# Patient Record
Sex: Male | Born: 1985
Health system: Southern US, Community
[De-identification: ages and names within clinical notes are randomized; demographics above are authoritative.]

## PROBLEM LIST (undated history)

## (undated) DIAGNOSIS — F419 Anxiety disorder, unspecified: Secondary | ICD-10-CM

---

## 2010-08-29 ENCOUNTER — Emergency Department (HOSPITAL_COMMUNITY)
Admission: EM | Admit: 2010-08-29 | Discharge: 2010-08-30 | Disposition: A | Discharge status: Home / Self Care | Payer: Self-pay | Attending: Emergency Medicine | Admitting: Emergency Medicine

## 2010-08-29 DIAGNOSIS — B9789 Other viral agents as the cause of diseases classified elsewhere: Secondary | ICD-10-CM | POA: Insufficient documentation

## 2010-08-29 DIAGNOSIS — J029 Acute pharyngitis, unspecified: Secondary | ICD-10-CM | POA: Insufficient documentation

## 2010-08-29 DIAGNOSIS — R509 Fever, unspecified: Secondary | ICD-10-CM | POA: Insufficient documentation

## 2010-08-29 DIAGNOSIS — R110 Nausea: Secondary | ICD-10-CM | POA: Insufficient documentation

## 2011-11-03 ENCOUNTER — Emergency Department (INDEPENDENT_AMBULATORY_CARE_PROVIDER_SITE_OTHER): Payer: No Typology Code available for payment source

## 2011-11-03 ENCOUNTER — Emergency Department (HOSPITAL_BASED_OUTPATIENT_CLINIC_OR_DEPARTMENT_OTHER)
Admission: EM | Admit: 2011-11-03 | Discharge: 2011-11-03 | Disposition: A | Discharge status: Home / Self Care | Payer: No Typology Code available for payment source | Attending: Emergency Medicine | Admitting: Emergency Medicine

## 2011-11-03 ENCOUNTER — Encounter (HOSPITAL_BASED_OUTPATIENT_CLINIC_OR_DEPARTMENT_OTHER): Payer: Self-pay | Admitting: *Deleted

## 2011-11-03 DIAGNOSIS — M25569 Pain in unspecified knee: Secondary | ICD-10-CM | POA: Insufficient documentation

## 2011-11-03 DIAGNOSIS — M542 Cervicalgia: Secondary | ICD-10-CM

## 2011-11-03 DIAGNOSIS — R079 Chest pain, unspecified: Secondary | ICD-10-CM

## 2011-11-03 DIAGNOSIS — S20219A Contusion of unspecified front wall of thorax, initial encounter: Secondary | ICD-10-CM

## 2011-11-03 MED ORDER — TETANUS-DIPHTH-ACELL PERTUSSIS 5-2.5-18.5 LF-MCG/0.5 IM SUSP
0.5000 mL | Freq: Once | INTRAMUSCULAR | Status: AC
  Administered 2011-11-03: 0.5 mL via INTRAMUSCULAR
  Filled 2011-11-03: qty 0.5

## 2011-11-03 MED ORDER — TRAMADOL HCL 50 MG PO TABS: 50.0000 mg | ORAL_TABLET | Freq: Four times a day (QID) | ORAL | Status: AC | PRN
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
## 2011-11-03 NOTE — Discharge Instructions (Signed)
Contusion A contusion is a deep bruise. Contusions are the result of an injury that caused bleeding under the skin. The contusion may turn blue, purple, or yellow. Minor injuries will give you a painless contusion, but more severe contusions may stay painful and swollen for a few weeks.  CAUSES  A contusion is usually caused by a blow, trauma, or direct force to an area of the body. SYMPTOMS   Swelling and redness of the injured area.   Bruising of the injured area.   Tenderness and soreness of the injured area.   Pain.  DIAGNOSIS  The diagnosis can be made by taking a history and physical exam. An X-ray, CT scan, or MRI may be needed to determine if there were any associated injuries, such as fractures. TREATMENT  Specific treatment will depend on what area of the body was injured. In general, the best treatment for a contusion is resting, icing, elevating, and applying cold compresses to the injured area. Over-the-counter medicines may also be recommended for pain control. Ask your caregiver what the best treatment is for your contusion. HOME CARE INSTRUCTIONS   Put ice on the injured area.   Put ice in a plastic bag.   Place a towel between your skin and the bag.   Leave the ice on for 15 to 20 minutes, 3 to 4 times a day.   Only take over-the-counter or prescription medicines for pain, discomfort, or fever as directed by your caregiver. Your caregiver may recommend avoiding anti-inflammatory medicines (aspirin, ibuprofen, and naproxen) for 48 hours because these medicines may increase bruising.   Rest the injured area.   If possible, elevate the injured area to reduce swelling.  SEEK IMMEDIATE MEDICAL CARE IF:   You have increased bruising or swelling.   You have pain that is getting worse.   Your swelling or pain is not relieved with medicines.  MAKE SURE YOU:   Understand these instructions.   Will watch your condition.   Will get help right away if you are not  doing well or get worse.  Document Released: 03/27/2005 Document Revised: 06/06/2011 Document Reviewed: 04/22/2011 [facility] Patient Information 2012 [place], [place].Chest Contusion A contusion is a deep bruise. Bruises happen when an injury causes bleeding under the skin. Signs of bruising include pain, puffiness (swelling), and discolored skin. The bruise may turn blue, purple, or yellow. Pay attention to how you are doing. HOME CARE  Put ice on the injured area.   Put ice in a plastic bag.   Place a towel between the skin and the bag.   Leave the ice on for 15 to 20 minutes at a time, 3 to 4 times a day for the first 48 hours.   Rest.   Do not lift anything heavy.   Limit your activity as told by your doctor   Take 3 to 4 deep breaths every hour while awake. Hold your hand or a pillow over the sore area for support.   Breathe from the belly (abdomen).   Breathe in through the nose, as if you are smelling a flower.   Breathe out through the mouth, as if you are blowing out a candle.   Only take medicine as told by your doctor.  GET HELP RIGHT AWAY IF:   You have trouble breathing or cough up thick spit (mucus).   You have chest pain that goes into the arms or jaw.   The skin is wet and pale.   You have  a fever.   You feel dizzy, weak, or pass out (faint).   You cannot breathe easily.   The bruise is getting worse.  MAKE SURE YOU:   Understand these instructions.   Will watch your condition.   Will get help right away if you are not doing well or get worse.  Document Released: 12/04/2007 Document Revised: 06/06/2011 Document Reviewed: 12/04/2007 [facility] Patient Information 2012 [place], [place].

## 2012-05-27 ENCOUNTER — Encounter (HOSPITAL_BASED_OUTPATIENT_CLINIC_OR_DEPARTMENT_OTHER): Payer: Self-pay | Admitting: *Deleted

## 2012-05-27 ENCOUNTER — Emergency Department (HOSPITAL_BASED_OUTPATIENT_CLINIC_OR_DEPARTMENT_OTHER)
Admission: EM | Admit: 2012-05-27 | Discharge: 2012-05-27 | Disposition: A | Discharge status: Home / Self Care | Payer: Self-pay | Attending: Emergency Medicine | Admitting: Emergency Medicine

## 2012-05-27 DIAGNOSIS — R05 Cough: Secondary | ICD-10-CM | POA: Insufficient documentation

## 2012-05-27 DIAGNOSIS — R509 Fever, unspecified: Secondary | ICD-10-CM | POA: Insufficient documentation

## 2012-05-27 DIAGNOSIS — F17200 Nicotine dependence, unspecified, uncomplicated: Secondary | ICD-10-CM | POA: Insufficient documentation

## 2012-05-27 DIAGNOSIS — R059 Cough, unspecified: Secondary | ICD-10-CM | POA: Insufficient documentation

## 2012-05-27 DIAGNOSIS — J020 Streptococcal pharyngitis: Secondary | ICD-10-CM | POA: Insufficient documentation

## 2012-05-27 DIAGNOSIS — J3489 Other specified disorders of nose and nasal sinuses: Secondary | ICD-10-CM | POA: Insufficient documentation

## 2012-05-27 MED ORDER — PENICILLIN G BENZATHINE 1200000 UNIT/2ML IM SUSP
1.2000 10*6.[IU] | Freq: Once | INTRAMUSCULAR | Status: AC
  Administered 2012-05-27: 1.2 10*6.[IU] via INTRAMUSCULAR
  Filled 2012-05-27: qty 2

## 2012-05-27 NOTE — ED Notes (Signed)
Pt amb to triage with quick steady gait in nad. Pt reports cough, congestion, sore throat and subjective temps since Monday.
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
## 2012-09-29 ENCOUNTER — Emergency Department (HOSPITAL_BASED_OUTPATIENT_CLINIC_OR_DEPARTMENT_OTHER)
Admission: EM | Admit: 2012-09-29 | Discharge: 2012-09-30 | Disposition: A | Discharge status: Home / Self Care | Payer: Self-pay | Attending: Emergency Medicine | Admitting: Emergency Medicine

## 2012-09-29 ENCOUNTER — Encounter (HOSPITAL_BASED_OUTPATIENT_CLINIC_OR_DEPARTMENT_OTHER): Payer: Self-pay | Admitting: *Deleted

## 2012-09-29 DIAGNOSIS — Z87828 Personal history of other (healed) physical injury and trauma: Secondary | ICD-10-CM | POA: Insufficient documentation

## 2012-09-29 DIAGNOSIS — R002 Palpitations: Secondary | ICD-10-CM

## 2012-09-29 DIAGNOSIS — F17200 Nicotine dependence, unspecified, uncomplicated: Secondary | ICD-10-CM | POA: Insufficient documentation

## 2012-09-29 DIAGNOSIS — R079 Chest pain, unspecified: Secondary | ICD-10-CM | POA: Insufficient documentation

## 2012-09-29 NOTE — ED Notes (Signed)
States he feels like his heart is beating to fast x 1 month. Today it was beating fast after playing basketball with his son.

## 2012-09-29 NOTE — ED Provider Notes (Signed)
History     CSN: [id number]  Arrival date & time 09/29/12  2111   First MD Initiated Contact with Patient 09/29/12 2300      Chief Complaint  Patient presents with  . Palpitations    (Consider location/radiation/quality/duration/timing/severity/associated sxs/prior treatment) HPI This is a 27 year old male previously healthy comes in today complaining of sharp left chest pain intermittently for several months. It comes on for a few seconds and resolves without intervention. It does not occur at night while sleeping. He does have some pain with deep breath occasionally. He had some injury to his chest wall and lifting several months ago but this has resolved. He states there have been times when he felt short of breath but then could place pressure on the left chest wall and it would resolve. He has not had any nausea, syncope, lightheadedness, or shortness of breath noted. He has no family history of early deaths, coronary artery disease, or DVTs or PEs. He has not been traveling has had no known recent illness.  History reviewed. No pertinent past medical history.  History reviewed. No pertinent past surgical history.  No family history on file.  History  Substance Use Topics  . Smoking status: Current Every Day Smoker -- 0.50 packs/day    Types: Cigarettes  . Smokeless tobacco: Not on file  . Alcohol Use: No      Review of Systems  All other systems reviewed and are negative.    Allergies  Review of patient's allergies indicates no known allergies.  Home Medications  No current outpatient prescriptions on file.  BP 139/74  Pulse 93  Temp(Src) 98.8 F (37.1 C) (Oral)  Resp 20  Wt 135 lb (61.236 kg)  BMI 19.93 kg/m2  SpO2 96%  Physical Exam  Nursing note and vitals reviewed. Constitutional: He is oriented to person, place, and time. He appears well-developed and well-nourished.  HENT:  Head: Normocephalic and atraumatic.  Right Ear: External ear normal.   Left Ear: External ear normal.  Nose: Nose normal.  Mouth/Throat: Oropharynx is clear and moist.  Eyes: Conjunctivae and EOM are normal. Pupils are equal, round, and reactive to light.  Neck: Normal range of motion. Neck supple.  Cardiovascular: Normal rate, regular rhythm, normal heart sounds and intact distal pulses.   Pulmonary/Chest: Effort normal and breath sounds normal. No respiratory distress. He has no wheezes. He exhibits no tenderness.  Abdominal: Soft. Bowel sounds are normal. He exhibits no distension and no mass. There is no tenderness. There is no guarding.  Musculoskeletal: Normal range of motion.  Neurological: He is alert and oriented to person, place, and time. He has normal reflexes. He exhibits normal muscle tone. Coordination normal.  Skin: Skin is warm and dry.  Psychiatric: He has a normal mood and affect. His behavior is normal. Judgment and thought content normal.    ED Course  Procedures (including critical care time)  Labs Reviewed - No data to display No results found.   No diagnosis found.    Date: 09/29/2012  Rate: 87  Rhythm: normal sinus rhythm  QRS Axis: normal  Intervals: normal  ST/T Wave abnormalities: normal  Conduction Disutrbances:none  Narrative Interpretation:   Old EKG Reviewed: none available   Symptoms discussed with patient and discussed with patient symptoms were to return to the emergency department. He is advised that he should obtain primary care and have full metabolic assessment. Eyes return if the symptoms, do not resolve, shortness of breath or worsening of his pain.  His symptoms consistent with activity and palpate palpitations but no symptoms concerning for coronary artery disease or severe dysrhythmia.        Hilario Quarry, MD 09/29/12 2351

## 2013-01-13 ENCOUNTER — Emergency Department (HOSPITAL_BASED_OUTPATIENT_CLINIC_OR_DEPARTMENT_OTHER)
Admission: EM | Admit: 2013-01-13 | Discharge: 2013-01-13 | Disposition: A | Discharge status: Home / Self Care | Payer: Self-pay | Attending: Emergency Medicine | Admitting: Emergency Medicine

## 2013-01-13 ENCOUNTER — Encounter (HOSPITAL_BASED_OUTPATIENT_CLINIC_OR_DEPARTMENT_OTHER): Payer: Self-pay | Admitting: *Deleted

## 2013-01-13 DIAGNOSIS — R599 Enlarged lymph nodes, unspecified: Secondary | ICD-10-CM | POA: Insufficient documentation

## 2013-01-13 DIAGNOSIS — F17200 Nicotine dependence, unspecified, uncomplicated: Secondary | ICD-10-CM | POA: Insufficient documentation

## 2013-01-13 DIAGNOSIS — J020 Streptococcal pharyngitis: Secondary | ICD-10-CM | POA: Insufficient documentation

## 2013-01-13 MED ORDER — IBUPROFEN 100 MG/5ML PO SUSP
800.0000 mg | Freq: Once | ORAL | Status: AC
  Administered 2013-01-13: 800 mg via ORAL
  Filled 2013-01-13: qty 40

## 2013-01-13 MED ORDER — PENICILLIN G BENZATHINE 1200000 UNIT/2ML IM SUSP
1.2000 10*6.[IU] | Freq: Once | INTRAMUSCULAR | Status: AC
  Administered 2013-01-13: 1.2 10*6.[IU] via INTRAMUSCULAR
  Filled 2013-01-13: qty 2

## 2013-01-13 NOTE — ED Notes (Signed)
Pt c/o sore throat x2 days with fever 

## 2013-01-13 NOTE — ED Notes (Signed)
MD at bedside. 

## 2013-01-13 NOTE — ED Provider Notes (Signed)
   History    CSN: [id number] Arrival date & time 01/13/13  1247  First MD Initiated Contact with Patient 01/13/13 1257     Chief Complaint  Patient presents with  . Sore Throat   (Consider location/radiation/quality/duration/timing/severity/associated sxs/prior Treatment) Patient is a 27 y.o. male presenting with pharyngitis. The history is provided by the patient.  Sore Throat This is a new problem. The current episode started yesterday. The problem occurs constantly. The problem has been rapidly worsening. The symptoms are aggravated by swallowing. Nothing relieves the symptoms. Treatments tried: nsaids. The treatment provided no relief.   History reviewed. No pertinent past medical history. History reviewed. No pertinent past surgical history. History reviewed. No pertinent family history. History  Substance Use Topics  . Smoking status: Current Every Day Smoker -- 0.50 packs/day    Types: Cigarettes  . Smokeless tobacco: Not on file  . Alcohol Use: No    Review of Systems  All other systems reviewed and are negative.    Allergies  Review of patient's allergies indicates no known allergies.  Home Medications  No current outpatient prescriptions on file. BP 111/72  Pulse 117  Temp(Src) 102.8 F (39.3 C) (Oral)  Resp 18  Ht 5\' 9"  (1.753 m)  Wt 135 lb (61.236 kg)  BMI 19.93 kg/m2  SpO2 100% Physical Exam  Nursing note and vitals reviewed. Constitutional: He is oriented to person, place, and time. He appears well-developed and well-nourished. No distress.  HENT:  Head: Normocephalic and atraumatic.  PO erythematous with slight exudates.  Neck: Normal range of motion. Neck supple.  Abdominal: Soft. Bowel sounds are normal.  Lymphadenopathy:    He has cervical adenopathy.  Neurological: He is alert and oriented to person, place, and time.  Skin: Skin is warm and dry. He is not diaphoretic.    ED Course  Procedures (including critical care time) Labs Reviewed   RAPID STREP SCREEN   No results found. No diagnosis found.  MDM  Patient wants bicillin.    Geoffery Lyons, MD 01/13/13 1321

## 2013-04-28 ENCOUNTER — Emergency Department (HOSPITAL_BASED_OUTPATIENT_CLINIC_OR_DEPARTMENT_OTHER)
Admission: EM | Admit: 2013-04-28 | Discharge: 2013-04-28 | Disposition: A | Discharge status: Home / Self Care | Payer: Self-pay | Attending: Emergency Medicine | Admitting: Emergency Medicine

## 2013-04-28 ENCOUNTER — Encounter (HOSPITAL_BASED_OUTPATIENT_CLINIC_OR_DEPARTMENT_OTHER): Payer: Self-pay | Admitting: Emergency Medicine

## 2013-04-28 DIAGNOSIS — F17200 Nicotine dependence, unspecified, uncomplicated: Secondary | ICD-10-CM | POA: Insufficient documentation

## 2013-04-28 DIAGNOSIS — R509 Fever, unspecified: Secondary | ICD-10-CM | POA: Insufficient documentation

## 2013-04-28 DIAGNOSIS — IMO0001 Reserved for inherently not codable concepts without codable children: Secondary | ICD-10-CM | POA: Insufficient documentation

## 2013-04-28 DIAGNOSIS — J029 Acute pharyngitis, unspecified: Secondary | ICD-10-CM | POA: Insufficient documentation

## 2013-04-28 MED ORDER — PENICILLIN G BENZATHINE 1200000 UNIT/2ML IM SUSP
1.2000 10*6.[IU] | Freq: Once | INTRAMUSCULAR | Status: AC
Start: 2013-04-28 — End: 2013-04-28
  Administered 2013-04-28: 1.2 10*6.[IU] via INTRAMUSCULAR
  Filled 2013-04-28: qty 2

## 2013-04-28 NOTE — ED Provider Notes (Signed)
Medical screening examination/treatment/procedure(s) were performed by non-physician practitioner and as supervising physician I was immediately available for consultation/collaboration.  EKG Interpretation   None         Kamy Poinsett, MD 04/28/13 1930 

## 2013-04-28 NOTE — ED Provider Notes (Signed)
CSN: [id number]     Arrival date & time 04/28/13  1759 History   First MD Initiated Contact with Patient 04/28/13 1804     Chief Complaint  Patient presents with  . Sore Throat   (Consider location/radiation/quality/duration/timing/severity/associated sxs/prior Treatment) Patient is a 27 y.o. male presenting with pharyngitis. The history is provided by the patient. No language interpreter was used.  Sore Throat This is a new problem. The current episode started yesterday. The problem occurs constantly. The problem has been unchanged. Associated symptoms include a fever and myalgias. Pertinent negatives include no congestion or coughing. The symptoms are aggravated by swallowing. He has tried NSAIDs for the symptoms. The treatment provided mild relief.    History reviewed. No pertinent past medical history. History reviewed. No pertinent past surgical history. History reviewed. No pertinent family history. History  Substance Use Topics  . Smoking status: Current Every Day Smoker -- 0.50 packs/day    Types: Cigarettes  . Smokeless tobacco: Not on file  . Alcohol Use: No    Review of Systems  Constitutional: Positive for fever.  HENT: Negative for congestion.   Respiratory: Negative for cough.   Cardiovascular: Negative.   Musculoskeletal: Positive for myalgias.    Allergies  Review of patient's allergies indicates no known allergies.  Home Medications   Current Outpatient Rx  Name  Route  Sig  Dispense  Refill  . ibuprofen (ADVIL,MOTRIN) 800 MG tablet   Oral   Take 800 mg by mouth every 8 (eight) hours as needed for pain.          BP 119/70  Pulse 110  Temp(Src) 99.7 F (37.6 C) (Oral)  Resp 18  Ht 5\' 9"  (1.753 m)  Wt 140 lb (63.504 kg)  BMI 20.67 kg/m2  SpO2 96% Physical Exam  Nursing note and vitals reviewed. Constitutional: He appears well-developed and well-nourished.  HENT:  Right Ear: External ear normal.  Left Ear: External ear normal.  Mouth/Throat:  Oropharyngeal exudate and posterior oropharyngeal erythema present. No tonsillar abscesses.  Cardiovascular: Normal rate and regular rhythm.   Pulmonary/Chest: Effort normal and breath sounds normal.  Abdominal: Soft. Bowel sounds are normal. There is no tenderness.    ED Course  Procedures (including critical care time) Labs Review Labs Reviewed - No data to display Imaging Review No results found.  EKG Interpretation   None       MDM   1. Pharyngitis    Will treat for strep based on history and exam    Teressa Lower, NP 04/28/13 1826

## 2013-04-28 NOTE — ED Notes (Signed)
Pt c/o sore throat x 1 day

## 2014-07-17 ENCOUNTER — Encounter (HOSPITAL_COMMUNITY): Payer: Self-pay | Admitting: Emergency Medicine

## 2014-07-17 ENCOUNTER — Emergency Department (HOSPITAL_COMMUNITY)
Admission: EM | Admit: 2014-07-17 | Discharge: 2014-07-17 | Disposition: A | Discharge status: Home / Self Care | Payer: Self-pay | Attending: Emergency Medicine | Admitting: Emergency Medicine

## 2014-07-17 DIAGNOSIS — Z720 Tobacco use: Secondary | ICD-10-CM | POA: Insufficient documentation

## 2014-07-17 DIAGNOSIS — J029 Acute pharyngitis, unspecified: Secondary | ICD-10-CM

## 2014-07-17 LAB — RAPID STREP SCREEN (MED CTR MEBANE ONLY): Streptococcus, Group A Screen (Direct): NEGATIVE

## 2014-07-17 MED ORDER — AMOXICILLIN 500 MG PO CAPS: 500.0000 mg | ORAL_CAPSULE | Freq: Three times a day (TID) | ORAL | Status: DC

## 2014-07-17 MED ORDER — SODIUM CHLORIDE 0.9 % IV BOLUS (SEPSIS)
500.0000 mL | Freq: Once | INTRAVENOUS | Status: AC
  Administered 2014-07-17: 500 mL via INTRAVENOUS

## 2014-07-17 MED ORDER — HYDROCODONE-ACETAMINOPHEN 7.5-325 MG/15ML PO SOLN: 15.0000 mL | ORAL | Status: DC | PRN

## 2014-07-17 MED ORDER — HYDROCODONE-ACETAMINOPHEN 7.5-325 MG/15ML PO SOLN
10.0000 mL | Freq: Once | ORAL | Status: AC
  Administered 2014-07-17: 10 mL via ORAL
  Filled 2014-07-17: qty 15

## 2014-07-17 NOTE — ED Provider Notes (Signed)
CSN: [id number]     Arrival date & time 07/17/14  0532 History   None    Chief Complaint  Patient presents with  . Sore Throat     (Consider location/radiation/quality/duration/timing/severity/associated sxs/prior Treatment) HPI Timothy Stark is a 29 y.o. male he comes in for evaluation of sore throat. Patient states he frequently gets sore throats and this feels like one of his typical episodes. States the pain has been present for about the past 2 days, but got worse last night. He has been taking Tylenol, ibuprofen and DayQuil with moderate relief. He reports fevers at home that have been controlled by these medications. He denies cough, headache, chest pain, short of breath, drooling, difficulties breathing or swallowing. Reports he quit smoking 2 months ago.  History reviewed. No pertinent past medical history. History reviewed. No pertinent past surgical history. History reviewed. No pertinent family history. History  Substance Use Topics  . Smoking status: Current Every Day Smoker -- 0.00 packs/day  . Smokeless tobacco: Not on file     Comment: Engineer, maintenance (IT)  . Alcohol Use: No    Review of Systems  All other systems reviewed and are negative.  A 10 point review of systems was completed and was negative except for pertinent positives and negatives as mentioned in the history of present illness     Allergies  Review of patient's allergies indicates no known allergies.  Home Medications   Prior to Admission medications   Medication Sig Start Date End Date Taking? Authorizing Provider  acetaminophen (TYLENOL) 325 MG tablet Take 650 mg by mouth every 6 (six) hours as needed for fever.   Yes Historical Provider, MD  ibuprofen (ADVIL,MOTRIN) 800 MG tablet Take 800 mg by mouth every 8 (eight) hours as needed for fever.    Yes Historical Provider, MD  Pseudoephedrine-APAP-DM (DAYQUIL PO) Take 1 capsule by mouth daily as needed (fever / congestion).   Yes  Historical Provider, MD  amoxicillin (AMOXIL) 500 MG capsule Take 1 capsule (500 mg total) by mouth 3 (three) times daily. 07/17/14   Sharlene Motts, PA-C  HYDROcodone-acetaminophen (HYCET) 7.5-325 mg/15 ml solution Take 15 mLs by mouth every 4 (four) hours as needed for moderate pain or severe pain. 07/17/14   Earle Gell Samella Lucchetti, PA-C   BP 109/57 mmHg  Pulse 90  Temp(Src) 99.1 F (37.3 C) (Oral)  Resp 16  SpO2 95% Physical Exam  Constitutional: He is oriented to person, place, and time. He appears well-developed and well-nourished.  HENT:  Head: Normocephalic and atraumatic.  Mild erythema in posterior oropharynx. No exudates present. Tonsils appear equal in size. There is no glossal elevation. No trismus. MMM. Tolerating secretions well, patent airway  Eyes: Conjunctivae are normal. Pupils are equal, round, and reactive to light. Right eye exhibits no discharge. Left eye exhibits no discharge. No scleral icterus.  Neck: Normal range of motion. Neck supple.  Cardiovascular: Normal rate, regular rhythm and normal heart sounds.   Pulmonary/Chest: Effort normal and breath sounds normal. No respiratory distress. He has no wheezes. He has no rales.  Abdominal: Soft. There is no tenderness.  Musculoskeletal: He exhibits no tenderness.  Lymphadenopathy:    He has cervical adenopathy.  Neurological: He is alert and oriented to person, place, and time.  Cranial Nerves II-XII grossly intact  Skin: Skin is warm and dry. No rash noted.  Psychiatric: He has a normal mood and affect.  Nursing note and vitals reviewed.   ED Course  Procedures (including critical care  time) Labs Review Labs Reviewed  RAPID STREP SCREEN  CULTURE, GROUP A STREP    Imaging Review No results found.   EKG Interpretation None     Meds given in ED:  Medications  HYDROcodone-acetaminophen (HYCET) 7.5-325 mg/15 ml solution 10 mL (10 mLs Oral Given 07/17/14 0722)  sodium chloride 0.9 % bolus 500 mL (0 mLs  Intravenous Stopped 07/17/14 0904)    Discharge Medication List as of 07/17/2014  7:03 AM    START taking these medications   Details  amoxicillin (AMOXIL) 500 MG capsule Take 1 capsule (500 mg total) by mouth 3 (three) times daily., Starting 07/17/2014, Until Discontinued, Print    HYDROcodone-acetaminophen (HYCET) 7.5-325 mg/15 ml solution Take 15 mLs by mouth every 4 (four) hours as needed for moderate pain or severe pain., Starting 07/17/2014, Until Discontinued, Print       Filed Vitals:   07/17/14 0730 07/17/14 0800 07/17/14 0900 07/17/14 0904  BP: 105/69 95/50 109/57   Pulse: 92 88 90   Temp:    99.1 F (37.3 C)  TempSrc:    Oral  Resp:      SpO2: 95% 93% 95%     MDM  Vitals stable - WNL -afebrile, temp 99.1 Pt resting comfortably in ED, appears well. PE--mild pharyngitis. No evidence of peritonsillar abscess. Patent Airway, tolerating secretions well.  Due to subjective fevers and fever on arrival, no cough, cervical adenopathy.-Will treat empirically with antibiotics Will DC with antibiotics, pain medicine. Encouraged continue symptomatic support with salt water gargles, coarse expiratory, Tylenol and ibuprofen I discussed all relevant lab findings and imaging results with pt and they verbalized understanding. Discussed f/u with PCP within 48 hrs and return precautions, pt very amenable to plan.  Prior to patient discharge, I discussed and reviewed this case with Dr. Madilyn Hookees   Final diagnoses:  Pharyngitis        Sharlene MottsBenjamin W Nelva Hauk, PA-C 07/17/14 0932  Tilden FossaElizabeth Rees, MD 07/17/14 [phone]

## 2014-07-17 NOTE — Discharge Instructions (Signed)
It is important for you to follow up with primary care, [place] and wellness for further evaluation and management of your symptoms. Please take all of your medicines as prescribed. You may take her pain medicine for severe pain. He may continue to alternate Tylenol and Motrin for fever relief. Continue using salt water gargles. Return to ED for worsening symptoms, worsening fever, difficulties swallowing, breathing, shortness of breath   Emergency Department Resource Guide 1) Find a Doctor and Pay Out of Pocket Although you won't have to find out who is covered by your insurance plan, it is a good idea to ask around and get recommendations. You will then need to call the office and see if the doctor you have chosen will accept you as a new patient and what types of options they offer for patients who are self-pay. Some doctors offer discounts or will set up payment plans for their patients who do not have insurance, but you will need to ask so you aren't surprised when you get to your appointment.  2) Contact Your Local Health Department Not all health departments have doctors that can see patients for sick visits, but many do, so it is worth a call to see if yours does. If you don't know where your local health department is, you can check in your phone book. The CDC also has a tool to help you locate your state's health department, and many state websites also have listings of all of their local health departments.  3) Find a Walk-in Clinic If your illness is not likely to be very severe or complicated, you may want to try a walk in clinic. These are popping up all over the country in pharmacies, drugstores, and shopping centers. They're usually staffed by nurse practitioners or physician assistants that have been trained to treat common illnesses and complaints. They're usually fairly quick and inexpensive. However, if you have serious medical issues or chronic medical problems, these are probably  not your best option.  No Primary Care Doctor: - Call Health Connect at  [phone] - they can help you locate a primary care doctor that  accepts your insurance, provides certain services, etc. - Physician Referral Service- [phone]  Chronic Pain Problems: Organization         Address  Phone   Notes  [facility]  [phone] Patients need to be referred by their primary care doctor.   Medication Assistance: Organization         Address  Phone   Notes  [facility] Medication [facility] [address] [place] [place], [place] [address] [phone] --Must be a resident of [facility] -- Must have NO insurance coverage whatsoever (no Medicaid/ Medicare, etc.) -- The pt. MUST have a primary care doctor that directs their care regularly and follows them in the community   [facility]  [phone]   [employer]  [phone]    Agencies that provide inexpensive medical care: Organization         Address  Phone   Notes  [facility]  [phone]   [facility]    [phone]   [facility] [address] [place], [place] [address] [phone]   [facility] [address] [place], [place] [phone]   [facility]anned Parenthood    [phone]   [facility]    [phone]   [facility]  [address], [place] Phone:  [phone], Fax:  [phone] Hours of Operation:  9 am - 6 pm, M-F.  Also accepts Medicaid/Medicare and self-pay.  [facility]  [address], [place] Phone: [phone], Fax: [phone]. Hours of Operation:  8:30 am - 5:30 pm, M-F.  Also accepts Medicaid and self-pay.  [facility] [place] [address] [place] Phone: [phone]   [facility] [address] Natasha Bence [place], [place] [phone], Ext. [phone] Mondays & Thursdays: 7-9 AM.   First 15 patients are seen on a first come, first serve basis.    Medicaid-accepting [facility] Providers:  Organization         Address  Phone   Notes  [facility] [address], [place] [phone] Also accepts self-pay patients.  [facility] [address] Laurell Josephs [place], [place]  [phone]   [facility] [address], [place] [phone]   [facility] [address], [place] [phone]   Renaye Rakers [address], [place]   [phone] Only accepts [place] patients after they have their name applied to their card.   Self-Pay (no insurance) in [facility]:  Organization         Address  Phone   Notes  Sickle Cell Patients, [facility] [address] [place], [place] [phone]   [facility] [address] [place], [place] [phone]   [facility] [place]  [address], [place] [phone]   [facility]/Dr. Osei-Bonsu  [address], [place] or [address], [place] [phone] Phone number for both [place] and [place] locations is the same.  [facility] [address], [place] [phone]   [facility] [address] [place] or [address] [phone]   [facility] [address], [place] [phone], phone; [phone], fax Sees patients 1st and 3rd Saturday of every month.  Must not qualify for public or private insurance (i.e. Medicaid, Medicare, [place] Health Choice, Veterans' Benefits)  Household income should be no more than 200% of the poverty level The clinic cannot treat you if you are pregnant or think you are pregnant  Sexually transmitted diseases are not treated at the clinic.    Dental  Care: Organization         Address  Phone  Notes  [facility] Department of [facility] [address] [place], [place] [phone] Accepts children up to age 63 who are enrolled in [place] or [place] Health Choice; pregnant women with a Medicaid card; and children who have applied for Medicaid or [place] Health Choice, but were declined, whose parents can pay a reduced fee at time of service.  [facility] Department of [facility]  [address], [place] [phone] Accepts children up to age 84 who are enrolled in [place] or [place] Health Choice; pregnant women with a Medicaid card; and children who have applied for Medicaid or [place] Health Choice, but were declined, whose parents can pay a reduced fee at time of service.  [facility]  [address] [place], [place] (  [phone]) [phone] Patients are seen by appointment only. Walk-ins are not accepted. [facility] will see patients 30 years of age and older. Monday - Tuesday (8am-5pm) Most Wednesdays (8:30-5pm) $30 per visit, cash only  [facility]  [address], [facility] [place] [phone] Patients are seen by appointment only. Walk-ins are not accepted. [facility] will see patients 30 years of age and older. One Wednesday Evening (Monthly: Volunteer Based).  $30 per visit, cash only  [facility]  [phone]) [phone] for adults; Children under age 664, call Graduate Pediatric Dentistry at [phone]) [phone]. Children aged 884-14, please call [phone]) [phone] to request a pediatric application.  Dental services are provided in all areas of dental care including fillings, crowns and bridges, complete and partial dentures, implants, gum treatment, root canals, and extractions. Preventive care is also provided. Treatment is provided to both adults and children. Patients are selected via a lottery and there is often a waiting list.   [facility] [address], [place]  [phone] [email or web address]   [facility] [address], [place], [place] 347 648 [phone], Ext. [phone] Second and Fourth Thursday of each month, opens at 6:30 AM; Clinic ends at 9 AM.  Patients are seen on a first-come first-served basis, and a limited number are seen during each clinic.   [facility]  [address], [place], [place] [phone]) [phone]   Eligibility Requirements You must have lived in [place], [place], or [place] for at least the last three months.   You cannot be eligible for state or federal sponsored [place]healthcare insurance, including CIGNAVeterans Administration, [place]icaid, or [employer]icare.   You generally cannot be eligible for healthcare insurance through your employer.    How to apply: Eligibility screenings are held every Tuesday and Wednesday afternoon from 1:00 pm until 4:00 pm. You do not need an appointment for the interview!  [facility] [address], [place], [place] [phone]   [facility]  [phone]   [facility]  [phone]   [facility]  [phone]    Behavioral Health Resources in the Community: Intensive Outpatient Programs Organization         Address  Phone  Notes  [facility] [address], [place], [place] [phone]   [facility] [address], [place], [place] [phone]   [facility] [address], [place], [place]  [phone]   [facility] [address],  [place], [place] [phone] or [phone]   Substance Abuse Resources Organization         Address  Phone  Notes  [facility]  [phone]   [facility]  [phone]   [facility]  [phone]   [facility]  [phone]   Residential & Outpatient Substance Abuse Program  [phone]     Psychological Services Organization         Address  Phone  Notes  [facility]  [phone]- [phone]   [facility]  [phone]- [phone]   [facility] [address], [place] [phone] or [phone]    Mobile Crisis Teams Organization         Address  Phone  Notes  [facility], Mobile Crisis Care Unit  [phone]   [facility]  [address] [place], [place] [phone]   Doristine LocksSharon DeEsch [phone]  [address] °[place] [phone]   ° °Self-Help/Support Groups °Organization         Address  Phone             Notes  °[facility] [place] - variety of support groups  336- [phone] Call for more information  °Narcotics Anonymous (NA), [facility] [address], °[place]  2 meetings at this location  ° °Residential Treatment Programs °Organization         Address  Phone  Notes  °[facility] [address],    °[place]  [phone]   °[facility] ° [address], [place], [place] [phone]   °[facility] [address], [place] [phone] Admissions: 8am-3pm M-F  °[facility] [address]    °[place], [place] [phone]   °[facility] [address], [place], [place] [phone]   °[facility] [address]  °[place], [place] [phone]   °[facility] - Intensive Outpatient [address], [place], [place] [phone]   °[facility] ([facility] [address]  °[place], [place] [phone] or [phone]   °[facility] ([facility]) [address] [place], [place] [phone] Accepts Medicaid  °[facility] [address]  °[place] 1-800-659-3381 Substance Abuse/Addiction Treatment  ° °[place] [facility] Health Resources °Organization         Address  Phone  Notes  °[facility]  ([phone]) [phone]   °Julie Brannon, PhD [address] [place], [place]   ([phone]) [phone] or (336) [phone]   °[facility]   [address] °[place], [place] (336) 349-4454   °[facility] [address], [place], [place] ([phone]) [phone] Insurance/Medicaid/sponsorship through [facility]  °[facility] [address]                                    [place], [place] [phone]) [phone] Therapy/tele-psych/case  °[facility] [address]  ° [place], [place] ([phone]) [phone]    °Dr. Arfeen  ([phone]) [phone]   °[facility] [place]  [employer] [place] [facility] 1) [address],  °2) [address], [place] °3)  [address], [place] ([phone]) [phone] °(336) [phone] ° °(336) [phone]   °[place] Child Abuse Hotline (336) 342-1394 or (336) [phone] (After Hours)    ° ° ° °

## 2014-07-17 NOTE — ED Notes (Signed)
Patient here with complaint of sore throat accompanied by fever. States that he has been keeping the fever under control with tylenol and ibuprofen. Fever present here, states that he last has dayquil liquid gel tabs about 1 hr ago.

## 2014-07-19 LAB — CULTURE, GROUP A STREP

## 2014-09-23 ENCOUNTER — Encounter (HOSPITAL_BASED_OUTPATIENT_CLINIC_OR_DEPARTMENT_OTHER): Payer: Self-pay | Admitting: *Deleted

## 2014-09-23 ENCOUNTER — Emergency Department (HOSPITAL_BASED_OUTPATIENT_CLINIC_OR_DEPARTMENT_OTHER)
Admission: EM | Admit: 2014-09-23 | Discharge: 2014-09-23 | Disposition: A | Discharge status: Home / Self Care | Payer: Self-pay | Attending: Emergency Medicine | Admitting: Emergency Medicine

## 2014-09-23 DIAGNOSIS — J029 Acute pharyngitis, unspecified: Secondary | ICD-10-CM | POA: Insufficient documentation

## 2014-09-23 LAB — RAPID STREP SCREEN (MED CTR MEBANE ONLY): Streptococcus, Group A Screen (Direct): NEGATIVE

## 2014-09-23 MED ORDER — DEXAMETHASONE SODIUM PHOSPHATE 10 MG/ML IJ SOLN
10.0000 mg | Freq: Once | INTRAMUSCULAR | Status: AC
Start: 2014-09-23 — End: 2014-09-23
  Administered 2014-09-23: 10 mg via INTRAMUSCULAR
  Filled 2014-09-23: qty 1

## 2014-09-23 NOTE — ED Provider Notes (Signed)
CSN: [id number]     Arrival date & time 09/23/14  1925 History   This chart was scribed for Timothy Emery, PA-C working with Pricilla Loveless, MD by Evon Slack, ED Scribe. This patient was seen in room MHFT1/MHFT1 and the patient's care was started at 8:34 PM.     Chief Complaint  Patient presents with  . Sore Throat    Patient is a 29 y.o. male presenting with pharyngitis. The history is provided by the patient. No language interpreter was used.  Sore Throat Associated symptoms include headaches.   HPI Comments: Jemarcus Dougal is a 29 y.o. male who presents to the Emergency Department complaining of sore throat onset 1 week prior. Pt states that he has associated right sided swelling. Pt reports intermittent HA that has resolved on its own. Pt states that it is painful to swallow. Pt states that his symptoms are worse in the morning and rates the pain 10/10. Pt states that the severity of his pain is currently 6/10. Pt states that he has been taken Advil and ciprocol drops with no relief. Denies fever, cough, ear pain, or rhinorrhea.   History reviewed. No pertinent past medical history. History reviewed. No pertinent past surgical history. History reviewed. No pertinent family history. History  Substance Use Topics  . Smoking status: Never Smoker   . Smokeless tobacco: Not on file     Comment: Engineer, maintenance (IT)  . Alcohol Use: No    Review of Systems  Constitutional: Negative for fever.  HENT: Positive for sore throat. Negative for ear pain, rhinorrhea and trouble swallowing.   Respiratory: Negative for cough.   Neurological: Positive for headaches.  All other systems reviewed and are negative.    Allergies  Review of patient's allergies indicates no known allergies.  Home Medications   Prior to Admission medications   Medication Sig Start Date End Date Taking? Authorizing Provider  acetaminophen (TYLENOL) 325 MG tablet Take 650 mg by mouth every 6 (six)  hours as needed for fever.    Historical Provider, MD  ibuprofen (ADVIL,MOTRIN) 800 MG tablet Take 800 mg by mouth every 8 (eight) hours as needed for fever.     Historical Provider, MD  Pseudoephedrine-APAP-DM (DAYQUIL PO) Take 1 capsule by mouth daily as needed (fever / congestion).    Historical Provider, MD   BP 126/70 mmHg  Pulse 92  Temp(Src) 99.2 F (37.3 C) (Oral)  Resp 18  Ht  (1.753 m)  Wt 132 lb (59.875 kg)  BMI 19.48 kg/m2  SpO2 98%   Physical Exam  Constitutional: He is oriented to person, place, and time. He appears well-developed and well-nourished. No distress.  HENT:  Head: Normocephalic and atraumatic.  Mouth/Throat: Oropharynx is clear and moist.  No drooling or stridor. Posterior pharynx mildly erythematous no significant tonsillar hypertrophy. No exudate. Soft palate rises symmetrically. No TTP or induration under tongue.   No tenderness to palpation of frontal or bilateral maxillary sinuses.  No mucosal edema in the nares.  Bilateral tympanic membranes with normal architecture and good light reflex.    Eyes: Conjunctivae and EOM are normal. Pupils are equal, round, and reactive to light.  Neck: Neck supple. No tracheal deviation present.  Cardiovascular: Normal rate, regular rhythm and intact distal pulses.   Pulmonary/Chest: Effort normal and breath sounds normal. No respiratory distress. He has no wheezes. He has no rales. He exhibits no tenderness.  Abdominal: Soft. Bowel sounds are normal. He exhibits no distension and no mass. There is  no tenderness. There is no rebound and no guarding.  Musculoskeletal: Normal range of motion. He exhibits no edema or tenderness.  Neurological: He is alert and oriented to person, place, and time.  Skin: Skin is warm and dry.  Psychiatric: He has a normal mood and affect. His behavior is normal.  Nursing note and vitals reviewed.   ED Course  Procedures (including critical care time) DIAGNOSTIC STUDIES: Oxygen  Saturation is 98% on RA, normal by my interpretation.    COORDINATION OF CARE: 8:44 PM-Discussed treatment plan with pt at bedside and pt agreed to plan.     Labs Review Labs Reviewed  RAPID STREP SCREEN  CULTURE, GROUP A STREP    Imaging Review No results found.   EKG Interpretation None      MDM   Final diagnoses:  Pharyngitis    Filed Vitals:   09/23/14 1933  BP: 126/70  Pulse: 92  Temp: 99.2 F (37.3 C)  TempSrc: Oral  Resp: 18  Height: 5\' 9"  (1.753 m)  Weight: 132 lb (59.875 kg)  SpO2: 98%    Medications  dexamethasone (DECADRON) injection 10 mg (not administered)    Casimiro NeedleDario Infante-Delacruz is a pleasant 29 y.o. male presenting with right-sided throat pain for 1 week, patient is afebrile, well-appearing, tolerating his secretions, no significant tonsillar hypertrophy, rapid strep is negative. Doubt deep tissue abscess. Patient will be given shot of Decadron, think this may be related to postnasal drip as he reports worsening of symptoms in the morning when he wakes. I've advised him to take Flonase as well. Extended discussion of return precautions. Patient verbalizes his understanding and will take NSAIDs  Evaluation does not show pathology that would require ongoing emergent intervention or inpatient treatment. Pt is hemodynamically stable and mentating appropriately. Discussed findings and plan with patient/guardian, who agrees with care plan. All questions answered. Return precautions discussed and outpatient follow up given.   I personally performed the services described in this documentation, which was scribed in my presence. The recorded information has been reviewed and is accurate.      Timothy Emeryicole Magin Balbi, PA-C 09/23/14 2203  Pricilla LovelessScott Goldston, MD 09/27/14 [id number] [id number]

## 2014-09-23 NOTE — Discharge Instructions (Signed)
For pain control please take ibuprofen (also known as Motrin or Advil) 800mg  (this is normally 4 over the counter pills) 3 times a day  for 5 days. Take with food to minimize stomach irritation.  Use nasal saline (you can try [employer] Simply Saline) at least 4 times a day, use saline 5-10 minutes before using the fluticasone (flonase) nasal spray  Do not use Afrin (Oxymetazoline)  Rest, wash hands frequently  and drink plenty of water.  You may try counter medication such as Mucinex or Sudafed decongestant.   Pharyngitis Pharyngitis is a sore throat (pharynx). There is redness, pain, and swelling of your throat. HOME CARE   Drink enough fluids to keep your pee (urine) clear or pale yellow.  Only take medicine as told by your doctor.  You may get sick again if you do not take medicine as told. Finish your medicines, even if you start to feel better.  Do not take aspirin.  Rest.  Rinse your mouth (gargle) with salt water ( tsp of salt per 1 qt of water) every 1-2 hours. This will help the pain.  If you are not at risk for choking, you can suck on hard candy or sore throat lozenges. GET HELP IF:  You have large, tender lumps on your neck.  You have a rash.  You cough up green, yellow-brown, or bloody spit. GET HELP RIGHT AWAY IF:   You have a stiff neck.  You drool or cannot swallow liquids.  You throw up (vomit) or are not able to keep medicine or liquids down.  You have very bad pain that does not go away with medicine.  You have problems breathing (not from a stuffy nose). MAKE SURE YOU:   Understand these instructions.  Will watch your condition.  Will get help right away if you are not doing well or get worse. Document Released: 12/04/2007 Document Revised: 04/07/2013 Document Reviewed: 02/22/2013 [facility] Patient Information 2015 [place], [place]. This information is not intended to replace advice given to you by your health care provider. Make sure you  discuss any questions you have with your health care provider.

## 2014-09-23 NOTE — ED Notes (Signed)
Pt reports (R) sided throat pain x 1 week.  Denies fever.

## 2014-09-26 LAB — CULTURE, GROUP A STREP: STREP A CULTURE: NEGATIVE

## 2015-08-03 ENCOUNTER — Encounter (HOSPITAL_BASED_OUTPATIENT_CLINIC_OR_DEPARTMENT_OTHER): Payer: Self-pay

## 2015-08-03 ENCOUNTER — Emergency Department (HOSPITAL_BASED_OUTPATIENT_CLINIC_OR_DEPARTMENT_OTHER)
Admission: EM | Admit: 2015-08-03 | Discharge: 2015-08-03 | Disposition: A | Discharge status: Home / Self Care | Payer: Self-pay | Attending: Emergency Medicine | Admitting: Emergency Medicine

## 2015-08-03 ENCOUNTER — Emergency Department (HOSPITAL_BASED_OUTPATIENT_CLINIC_OR_DEPARTMENT_OTHER): Payer: Self-pay

## 2015-08-03 DIAGNOSIS — N492 Inflammatory disorders of scrotum: Secondary | ICD-10-CM

## 2015-08-03 DIAGNOSIS — N50812 Left testicular pain: Secondary | ICD-10-CM | POA: Insufficient documentation

## 2015-08-03 DIAGNOSIS — Z87891 Personal history of nicotine dependence: Secondary | ICD-10-CM | POA: Insufficient documentation

## 2015-08-03 DIAGNOSIS — R110 Nausea: Secondary | ICD-10-CM | POA: Insufficient documentation

## 2015-08-03 DIAGNOSIS — R634 Abnormal weight loss: Secondary | ICD-10-CM | POA: Insufficient documentation

## 2015-08-03 DIAGNOSIS — N5089 Other specified disorders of the male genital organs: Secondary | ICD-10-CM | POA: Insufficient documentation

## 2015-08-03 DIAGNOSIS — R2240 Localized swelling, mass and lump, unspecified lower limb: Secondary | ICD-10-CM | POA: Insufficient documentation

## 2015-08-03 LAB — CBC WITH DIFFERENTIAL/PLATELET
BASOS ABS: 0 10*3/uL (ref 0.0–0.1)
BASOS PCT: 0 %
Eosinophils Absolute: 0 10*3/uL (ref 0.0–0.7)
Eosinophils Relative: 0 %
HEMATOCRIT: 48.6 % (ref 39.0–52.0)
HEMOGLOBIN: 16.3 g/dL (ref 13.0–17.0)
Lymphocytes Relative: 21 %
Lymphs Abs: 2 10*3/uL (ref 0.7–4.0)
MCH: 29.1 pg (ref 26.0–34.0)
MCHC: 33.5 g/dL (ref 30.0–36.0)
MCV: 86.6 fL (ref 78.0–100.0)
Monocytes Absolute: 0.7 10*3/uL (ref 0.1–1.0)
Monocytes Relative: 7 %
NEUTROS ABS: 6.8 10*3/uL (ref 1.7–7.7)
NEUTROS PCT: 72 %
Platelets: 214 10*3/uL (ref 150–400)
RBC: 5.61 MIL/uL (ref 4.22–5.81)
RDW: 13.1 % (ref 11.5–15.5)
WBC: 9.5 10*3/uL (ref 4.0–10.5)

## 2015-08-03 LAB — BASIC METABOLIC PANEL
ANION GAP: 5 (ref 5–15)
BUN: 12 mg/dL (ref 6–20)
CALCIUM: 9.2 mg/dL (ref 8.9–10.3)
CHLORIDE: 105 mmol/L (ref 101–111)
CO2: 27 mmol/L (ref 22–32)
Creatinine, Ser: 1.11 mg/dL (ref 0.61–1.24)
GFR calc non Af Amer: >60 mL/min (ref >60)
Glucose, Bld: 104 mg/dL — ABNORMAL HIGH (ref 65–99)
Potassium: 3.5 mmol/L (ref 3.5–5.1)
Sodium: 137 mmol/L (ref 135–145)

## 2015-08-03 LAB — URINALYSIS, ROUTINE W REFLEX MICROSCOPIC
Bilirubin Urine: NEGATIVE
Glucose, UA: NEGATIVE mg/dL
Hgb urine dipstick: NEGATIVE
KETONES UR: NEGATIVE mg/dL
LEUKOCYTES UA: NEGATIVE
NITRITE: NEGATIVE
PROTEIN: NEGATIVE mg/dL
Specific Gravity, Urine: 1.025 (ref 1.005–1.030)
pH: 6 (ref 5.0–8.0)

## 2015-08-03 MED ORDER — ONDANSETRON HCL 4 MG PO TABS: 4.0000 mg | ORAL_TABLET | Freq: Four times a day (QID) | ORAL | Status: DC

## 2015-08-03 MED ORDER — GI COCKTAIL ~~LOC~~
30.0000 mL | Freq: Once | ORAL | Status: AC
  Administered 2015-08-03: 30 mL via ORAL
  Filled 2015-08-03: qty 30

## 2015-08-03 NOTE — ED Provider Notes (Signed)
CSN: [id number]     Arrival date & time 08/03/15  1653 History   First MD Initiated Contact with Patient 08/03/15 1702     Chief Complaint  Patient presents with  . Groin Swelling     (Consider location/radiation/quality/duration/timing/severity/associated sxs/prior Treatment) Patient is a 30 y.o. male presenting with male genitourinary complaint. The history is provided by the patient.  Male GU Problem Presenting symptoms: scrotal pain   Context: spontaneously   Context: not after injury and not after urination   Worsened by:  Nothing tried Ineffective treatments:  None tried Associated symptoms: no abdominal pain, no diarrhea, no fever, no nausea and no vomiting     30 yo M with a chief complaint of left-sided testicular pain. This been going on for as long as he can remember. Patient states that he has had multiple evaluations as a small child for this. Not sure what the exact etiology is. Patient states that he has noted that he has a lump to the base of his left testicle. He has not sought medical care for this condition is worried what the diagnosis might be. Over the past couple months patient hasn't overtly lost about 6 pounds. He also has been working in a job where he has been more active and has been eating less. Patient denies any fevers or chills. Denies rash. Denies history of sexually transmitted disease. Denies infidelity.  History reviewed. No pertinent past medical history. History reviewed. No pertinent past surgical history. No family history on file. Social History  Substance Use Topics  . Smoking status: Former Smoker -- 0.00 packs/day  . Smokeless tobacco: None  . Alcohol Use: Yes     Comment: occ    Review of Systems  Constitutional: Positive for unexpected weight change. Negative for fever and chills.  HENT: Negative for congestion and facial swelling.   Eyes: Negative for discharge and visual disturbance.  Respiratory: Negative for shortness of breath.    Cardiovascular: Negative for chest pain and palpitations.  Gastrointestinal: Negative for nausea, vomiting, abdominal pain and diarrhea.  Genitourinary: Positive for testicular pain.  Musculoskeletal: Negative for myalgias and arthralgias.  Skin: Negative for color change and rash.  Neurological: Negative for tremors, syncope and headaches.  Psychiatric/Behavioral: Negative for confusion and dysphoric mood.      Allergies  Review of patient's allergies indicates no known allergies.  Home Medications   Prior to Admission medications   Medication Sig Start Date End Date Taking? Authorizing Provider  ondansetron (ZOFRAN) 4 MG tablet Take 1 tablet (4 mg total) by mouth every 6 (six) hours. 08/03/15   Melene Plan, DO   BP 153/101 mmHg  Pulse 92  Temp(Src) 98.4 F (36.9 C) (Oral)  Resp 18  Wt 145 lb (65.772 kg)  SpO2 99% Physical Exam  Constitutional: He is oriented to person, place, and time. He appears well-developed and well-nourished.  HENT:  Head: Normocephalic and atraumatic.  Eyes: EOM are normal. Pupils are equal, round, and reactive to light.  Neck: Normal range of motion. Neck supple. No JVD present.  Cardiovascular: Normal rate and regular rhythm.  Exam reveals no gallop and no friction rub.   No murmur heard. Pulmonary/Chest: No respiratory distress. He has no wheezes.  Abdominal: He exhibits no distension. There is no rebound and no guarding. Hernia confirmed negative in the right inguinal area and confirmed negative in the left inguinal area.  Genitourinary: Cremasteric reflex is present. Right testis shows no mass, no swelling and no tenderness. Right testis  is descended. Cremasteric reflex is not absent on the right side. Left testis shows mass. Left testis shows no swelling and no tenderness. Left testis is descended. Cremasteric reflex is not absent on the left side. Uncircumcised.  Small nodule to base of left testicle, no noted tenderness, no hernia   Musculoskeletal: Normal range of motion.  Lymphadenopathy:       Right: No inguinal adenopathy present.       Left: No inguinal adenopathy present.  Neurological: He is alert and oriented to person, place, and time.  Skin: No rash noted. No pallor.  Psychiatric: He has a normal mood and affect. His behavior is normal.  Nursing note and vitals reviewed.   ED Course  Procedures (including critical care time) Labs Review Labs Reviewed  BASIC METABOLIC PANEL - Abnormal; Notable for the following:    Glucose, Bld 104 (*)    All other components within normal limits  URINALYSIS, ROUTINE W REFLEX MICROSCOPIC (NOT AT [facility])  CBC WITH DIFFERENTIAL/PLATELET  GC/CHLAMYDIA PROBE AMP ([facility]) NOT AT [facility]    Imaging Review US Scrotum  08/03/2015  CLINICAL DATA:  Left testicular pain. EXAM: SCROTAL ULTRASOUND DOPPLER ULTRASOUND OF THE TESTICLES TECHNIQUE: Complete ultrasound examination of the testicles, epididymis, and other scrotal structures was performed. Color and spectral Doppler ultrasound were also utilized to evaluate blood flow to the testicles. COMPARISON:  None. FINDINGS: Right testicle Measurements: 3.9 x 2.1 x 2.5 cm. No mass or microlithiasis visualized. Left testicle Measurements: 3 x 1.9 x 2.3 cm. No mass or microlithiasis visualized. Right epididymis:  Small cysts noted. Left epididymis:  Normal in size and appearance. Hydrocele:  None visualized. Varicocele:  None visualized. Other: 4 mm left scrotal pearl is identified. This corresponds to the area of concern. Pulsed Doppler interrogation of both testes demonstrates normal low resistance arterial and venous waveforms bilaterally. IMPRESSION: 1. No evidence for testicular mass or torsion. 2. Small testicular pearl corresponds to the area of concern. Electronically Signed   By: Signa Kell M.D.   On: 08/03/2015 18:08   [place] Art/ven Flow Abd Pelv Doppler  08/03/2015  CLINICAL DATA:  Left testicular pain. EXAM: SCROTAL ULTRASOUND  DOPPLER ULTRASOUND OF THE TESTICLES TECHNIQUE: Complete ultrasound examination of the testicles, epididymis, and other scrotal structures was performed. Color and spectral Doppler ultrasound were also utilized to evaluate blood flow to the testicles. COMPARISON:  None. FINDINGS: Right testicle Measurements: 3.9 x 2.1 x 2.5 cm. No mass or microlithiasis visualized. Left testicle Measurements: 3 x 1.9 x 2.3 cm. No mass or microlithiasis visualized. Right epididymis:  Small cysts noted. Left epididymis:  Normal in size and appearance. Hydrocele:  None visualized. Varicocele:  None visualized. Other: 4 mm left scrotal pearl is identified. This corresponds to the area of concern. Pulsed Doppler interrogation of both testes demonstrates normal low resistance arterial and venous waveforms bilaterally. IMPRESSION: 1. No evidence for testicular mass or torsion. 2. Small testicular pearl corresponds to the area of concern. Electronically Signed   By: Signa Kell M.D.   On: 08/03/2015 18:08   I have personally reviewed and evaluated these images and lab results as part of my medical decision-making.   EKG Interpretation None      MDM   Final diagnoses:  Left testicular pain  Nausea  Scrotal nodule    30 yo M with a chief complaint of left-sided testicular mass. Will obtain an ultrasound to further evaluate. Patient already has a follow-up appointment for urology in 6 days time.  Ultrasound with a scrotolith.  Discussed results with patient, if still concerned will follow up with urology.  Referred to PCP with complaint of weight loss.  6:36 PM:  I have discussed the diagnosis/risks/treatment options with the patient and believe the pt to be eligible for discharge home to follow-up with PCP. We also discussed returning to the ED immediately if new or worsening sx occur. We discussed the sx which are most concerning (e.g., sudden worsening pain, fever, inability to tolerate by mouth) that necessitate  immediate return. Medications administered to the patient during their visit and any new prescriptions provided to the patient are listed below.  Medications given during this visit Medications  gi cocktail (Maalox,Lidocaine,Donnatal) (30 mLs Oral Given 08/03/15 1725)    New Prescriptions   ONDANSETRON (ZOFRAN) 4 MG TABLET    Take 1 tablet (4 mg total) by mouth every 6 (six) hours.    The patient appears reasonably screen and/or stabilized for discharge and I doubt any other medical condition or other [facility] requiring further screening, evaluation, or treatment in the ED at this time prior to discharge.    Melene Plan, DO 08/03/15 1836

## 2015-08-03 NOTE — Discharge Instructions (Signed)
Scrotoliths or scrotal pearls are benign incidental extra testicular macrocalcifcations within the scrotum. They frequently occupy the potential space of the tunica vaginalis or sinus of the epidydimis. They are usually of no clinical significance.  Try zantac twice a day for your nausea.  Follow up with a family doctor.

## 2015-08-03 NOTE — ED Notes (Signed)
Patient transported to Ultrasound 

## 2015-08-03 NOTE — ED Notes (Addendum)
C/o lump to left testicle "for years"-c/o swelling and pain to testicle, abd pain, decreased appetite x 3 days-6lb weight loss in last 2 weeks

## 2015-08-03 NOTE — ED Notes (Signed)
MD at bedside. 

## 2016-06-06 ENCOUNTER — Encounter (HOSPITAL_BASED_OUTPATIENT_CLINIC_OR_DEPARTMENT_OTHER): Payer: Self-pay | Admitting: *Deleted

## 2016-06-06 ENCOUNTER — Emergency Department (HOSPITAL_BASED_OUTPATIENT_CLINIC_OR_DEPARTMENT_OTHER)
Admission: EM | Admit: 2016-06-06 | Discharge: 2016-06-06 | Disposition: A | Discharge status: Home / Self Care | Payer: Self-pay | Attending: Emergency Medicine | Admitting: Emergency Medicine

## 2016-06-06 DIAGNOSIS — X501XXA Overexertion from prolonged static or awkward postures, initial encounter: Secondary | ICD-10-CM | POA: Insufficient documentation

## 2016-06-06 DIAGNOSIS — Z87891 Personal history of nicotine dependence: Secondary | ICD-10-CM | POA: Insufficient documentation

## 2016-06-06 DIAGNOSIS — R109 Unspecified abdominal pain: Secondary | ICD-10-CM

## 2016-06-06 DIAGNOSIS — T148XXA Other injury of unspecified body region, initial encounter: Secondary | ICD-10-CM

## 2016-06-06 DIAGNOSIS — Y9389 Activity, other specified: Secondary | ICD-10-CM | POA: Insufficient documentation

## 2016-06-06 DIAGNOSIS — Y929 Unspecified place or not applicable: Secondary | ICD-10-CM | POA: Insufficient documentation

## 2016-06-06 DIAGNOSIS — Y990 Civilian activity done for income or pay: Secondary | ICD-10-CM | POA: Insufficient documentation

## 2016-06-06 DIAGNOSIS — S39012A Strain of muscle, fascia and tendon of lower back, initial encounter: Secondary | ICD-10-CM | POA: Insufficient documentation

## 2016-06-06 NOTE — ED Notes (Signed)
ED Provider at bedside. 

## 2016-06-06 NOTE — Discharge Instructions (Signed)
Take 4 over the counter ibuprofen tablets 3 times a day or 2 over-the-counter naproxen tablets twice a day for pain. Also take tylenol 1000mg(2 extra strength) four times a day.    

## 2016-06-06 NOTE — ED Triage Notes (Signed)
Pt reports left flank pain after moving heavy pallet with a strap of Sunday. States pain continues "aching in left flank". Denies urinary Sx

## 2016-06-06 NOTE — ED Provider Notes (Signed)
[facility] Note   CSN: [id number] Arrival date & time: 06/06/16  [id number]     History   Chief Complaint Chief Complaint  Patient presents with  . Flank Pain    HPI Timothy Stark is a 30 y.o. male.  30 yo M with a chief complaint of left-sided low back pain. Going on for the past week or so. Patient stated started right after he lifted some really heavy packages. 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 He has been having pain usually at the end of the day after doing heavy lifting and twisting. He has tried icy hot with some relief. Denies radiation down the leg. Denies loss of bowel or bladder. Denies abdominal tenderness nausea or vomiting or fevers. Denies dysuria denies flank pain. Feels that the pain is consistent. Usually does not know sit during the day and then really starts to bother him after a days work.   The history is provided by the patient.  Flank Pain  This is a new problem. The current episode started more than 2 days ago. The problem occurs constantly. The problem has not changed since onset.Pertinent negatives include no chest pain, no abdominal pain, no headaches and no shortness of breath. Nothing aggravates the symptoms. Nothing relieves the symptoms. He has tried nothing for the symptoms. The treatment provided no relief.    History reviewed. No pertinent past medical history.  There are no active problems to display for this patient.   History reviewed. No pertinent surgical history.     Home Medications    Prior to Admission medications   Medication Sig Start Date End Date Taking? Authorizing Provider  ondansetron (ZOFRAN) 4 MG tablet Take 1 tablet (4 mg total) by mouth every 6 (six) hours. 08/03/15   Melene Planan Niko Penson, DO    Family History No family history on file.  Social History Social History  Substance Use Topics  . Smoking status: Former Smoker    Packs/day: 0.00  . Smokeless tobacco: Never Used  . Alcohol use Yes   Comment: occ     Allergies   Patient has no known allergies.   Review of Systems Review of Systems  Constitutional: Negative for chills and fever.  HENT: Negative for congestion and facial swelling.   Eyes: Negative for discharge and visual disturbance.  Respiratory: Negative for shortness of breath.   Cardiovascular: Negative for chest pain and palpitations.  Gastrointestinal: Negative for abdominal pain, diarrhea and vomiting.  Genitourinary: Positive for flank pain.  Musculoskeletal: Positive for arthralgias and myalgias.  Skin: Negative for color change and rash.  Neurological: Negative for tremors, syncope and headaches.  Psychiatric/Behavioral: Negative for confusion and dysphoric mood.     Physical Exam Updated Vital Signs BP 144/83 (BP Location: Left Arm)   Pulse 99   Temp 98 F (36.7 C) (Oral)   Resp 18   Ht 5\' 9"  (1.753 m)   Wt 135 lb (61.2 kg)   SpO2 96%   BMI 19.94 kg/m   Physical Exam  Constitutional: He is oriented to person, place, and time. He appears well-developed and well-nourished.  HENT:  Head: Normocephalic and atraumatic.  Eyes: EOM are normal. Pupils are equal, round, and reactive to light.  Neck: Normal range of motion. Neck supple. No JVD present.  Cardiovascular: Normal rate and regular rhythm.  Exam reveals no gallop and no friction rub.   No murmur heard. Pulmonary/Chest: No respiratory distress. He has no wheezes.  Abdominal: He exhibits no distension and  no mass. There is no tenderness. There is no rebound and no guarding.  Musculoskeletal: Normal range of motion. He exhibits no edema or tenderness.  Pulse motor and sensation intact to the left lower extremity. Negative straight leg raise test bilaterally.  Neurological: He is alert and oriented to person, place, and time.  Skin: No rash noted. No pallor.  Psychiatric: He has a normal mood and affect. His behavior is normal.  Nursing note and vitals reviewed.    ED Treatments /  Results  Labs (all labs ordered are listed, but only abnormal results are displayed) Labs Reviewed - No data to display  EKG  EKG Interpretation None       Radiology No results found.  Procedures Procedures (including critical care time)  Medications Ordered in ED Medications - No data to display   Initial Impression / Assessment and Plan / ED Course  I have reviewed the triage vital signs and the nursing notes.  Pertinent labs & imaging results that were available during my care of the patient were reviewed by me and considered in my medical decision making (see chart for details).  Clinical Course     30 yo M With a chief complaint of left lower back pain. Muscular skeletal by history. See no need for labs or imaging. Trial of NSAIDs Tylenol and rest.  10:25 AM:  I have discussed the diagnosis/risks/treatment options with the patient and believe the pt to be eligible for discharge home to follow-up with PCP. We also discussed returning to the ED immediately if new or worsening sx occur. We discussed the sx which are most concerning (e.g., sudden worsening pain, fever, inability to tolerate by mouth) that necessitate immediate return. Medications administered to the patient during their visit and any new prescriptions provided to the patient are listed below.  Medications given during this visit Medications - No data to display   The patient appears reasonably screen and/or stabilized for discharge and I doubt any other medical condition or other [facility] requiring further screening, evaluation, or treatment in the ED at this time prior to discharge.    Final Clinical Impressions(s) / ED Diagnoses   Final diagnoses:  Left flank pain  Muscle strain    New Prescriptions New Prescriptions   No medications on file     Melene PlanDan Tali Coster, DO 06/06/16 1025

## 2016-07-16 ENCOUNTER — Encounter (HOSPITAL_BASED_OUTPATIENT_CLINIC_OR_DEPARTMENT_OTHER): Payer: Self-pay | Admitting: *Deleted

## 2016-07-16 ENCOUNTER — Emergency Department (HOSPITAL_BASED_OUTPATIENT_CLINIC_OR_DEPARTMENT_OTHER)
Admission: EM | Admit: 2016-07-16 | Discharge: 2016-07-16 | Disposition: A | Discharge status: Home / Self Care | Payer: Self-pay | Attending: Emergency Medicine | Admitting: Emergency Medicine

## 2016-07-16 DIAGNOSIS — Z87891 Personal history of nicotine dependence: Secondary | ICD-10-CM | POA: Insufficient documentation

## 2016-07-16 DIAGNOSIS — G44209 Tension-type headache, unspecified, not intractable: Secondary | ICD-10-CM

## 2016-07-16 DIAGNOSIS — Z79899 Other long term (current) drug therapy: Secondary | ICD-10-CM | POA: Insufficient documentation

## 2016-07-16 DIAGNOSIS — R0981 Nasal congestion: Secondary | ICD-10-CM

## 2016-07-16 DIAGNOSIS — R0789 Other chest pain: Secondary | ICD-10-CM

## 2016-07-16 DIAGNOSIS — R0781 Pleurodynia: Secondary | ICD-10-CM | POA: Insufficient documentation

## 2016-07-16 MED ORDER — IBUPROFEN 400 MG PO TABS
400.0000 mg | ORAL_TABLET | Freq: Once | ORAL | Status: AC | PRN
  Administered 2016-07-16: 400 mg via ORAL
  Filled 2016-07-16: qty 1

## 2016-07-16 NOTE — ED Provider Notes (Signed)
[facility] Provider Note   CSN: [id number] Arrival date & time: 07/16/16  1637   By signing my name below, I, Nelwyn SalisburyJoshua Fowler, attest that this documentation has been prepared under the direction and in the presence of Nira ConnPedro Eduardo Colby Reels, MD . Electronically Signed: Nelwyn SalisburyJoshua Fowler, Scribe. 07/16/2016. 7:46 PM.  History   Chief Complaint Chief Complaint  Patient presents with  . Headache  . Facial Pain   The history is provided by the patient. No language interpreter was used.    HPI Comments:  Timothy Stark is an otherwise healthy 31 y.o. male who presents to the Emergency Department complaining of constant, unchanged headache beginning about a week ago. Pt describes his symptoms as a pressure-like pain diffusely through his head, exacerbated by leaning forward. He has tried 600mg  of Ibuprofen BID for his headache, with temporary relief. Pt reports associated congestion, intermittent, sharp LUQ abdominal pain and a sensation of dry nares. He notes that his abdominal pain lasts 1 to 30 minutes and has been present for a month. His abdominal pain is temporarily relieved with a heating pad and Icy Hot. Pt denies any recent fever or rhinorrhea.   History reviewed. No pertinent past medical history.  There are no active problems to display for this patient.  History reviewed. No pertinent surgical history.  Home Medications    Prior to Admission medications   Medication Sig Start Date End Date Taking? Authorizing Provider  ondansetron (ZOFRAN) 4 MG tablet Take 1 tablet (4 mg total) by mouth every 6 (six) hours. 08/03/15   Melene Planan Floyd, DO   Family History No family history on file.  Social History Social History  Substance Use Topics  . Smoking status: Former Smoker    Packs/day: 0.00  . Smokeless tobacco: Never Used  . Alcohol use Yes     Comment: occ   Allergies   Patient has no known allergies.   Review of Systems Review of Systems 10 Systems  reviewed and are negative for acute change except as noted in the HPI.   Physical Exam Updated Vital Signs BP 116/81 (BP Location: Right Arm)   Pulse 78   Temp 98.2 F (36.8 C) (Oral)   Resp 16   Ht 5\' 9"  (1.753 m)   Wt 135 lb (61.2 kg)   SpO2 98%   BMI 19.94 kg/m   Physical Exam  Constitutional: He is oriented to person, place, and time. He appears well-developed and well-nourished. No distress.  HENT:  Head: Normocephalic and atraumatic.  Right Ear: Tympanic membrane normal.  Left Ear: Tympanic membrane normal.  Nose: Nose normal.  Mouth/Throat: Oropharynx is clear and moist. No oropharyngeal exudate.  Left nare erythematous  Eyes: Conjunctivae and EOM are normal. Pupils are equal, round, and reactive to light. Right eye exhibits no discharge. Left eye exhibits no discharge. No scleral icterus.  Neck: Normal range of motion. Neck supple.  Cardiovascular: Normal rate and regular rhythm.  Exam reveals no gallop and no friction rub.   No murmur heard. Pulmonary/Chest: Effort normal and breath sounds normal. No stridor. No respiratory distress. He has no rales.  Abdominal: Soft. He exhibits no distension. There is no tenderness.  Musculoskeletal: He exhibits no edema or tenderness.  Rib exacerbated with pulling of LUE against resistance  Neurological: He is alert and oriented to person, place, and time.  Mental Status: Alert and oriented to person, place, and time. Attention and concentration normal. Speech clear. Recent memory is intac  Cranial Nerves  II Visual Fields: Intact to confrontation. Visual fields intact. III, IV, VI: Pupils equal and reactive to light and near. Full eye movement without nystagmus  V Facial Sensation: Normal. No weakness of masticatory muscles  VII: No facial weakness or asymmetry  VIII Auditory Acuity: Grossly normal  IX/X: The uvula is midline; the palate elevates symmetrically  XI: Normal sternocleidomastoid and trapezius strength  XII: The  tongue is midline. No atrophy or fasciculations.   Motor System: Muscle Strength: 5/5 and symmetric in the upper and lower extremities. No pronation or drift.  Muscle Tone: Tone and muscle bulk are normal in the upper and lower extremities.   Reflexes: DTRs: 2+ and symmetrical in all four extremities. Plantar responses are flexor bilaterally.  Coordination: Intact finger-to-nose, heel-to-shin, and rapid alternating movements. No tremor.  Sensation: Intact to light touch, and pinprick. Negative Romberg test.  Gait: Routine gait normal    Skin: Skin is warm and dry. No rash noted. He is not diaphoretic. No erythema.  Psychiatric: He has a normal mood and affect.  Vitals reviewed.    ED Treatments / Results  DIAGNOSTIC STUDIES:  Oxygen Saturation is 98% on RA, normal by my interpretation.    COORDINATION OF CARE:  8:42 PM Discussed treatment plan with pt at bedside which includes OTC nasal decongestants and pt agreed to plan.  Labs (all labs ordered are listed, but only abnormal results are displayed) Labs Reviewed - No data to display  EKG  EKG Interpretation None       Radiology No results found.  Procedures Procedures (including critical care time)  Medications Ordered in ED Medications  ibuprofen (ADVIL,MOTRIN) tablet 400 mg (400 mg Oral Given 07/16/16 1918)     Initial Impression / Assessment and Plan / ED Course  I have reviewed the triage vital signs and the nursing notes.  Pertinent labs & imaging results that were available during my care of the patient were reviewed by me and considered in my medical decision making (see chart for details).  Clinical Course     1. Headache. Non focal neuro exam. No recent head trauma. No fever. Doubt meningitis. Doubt intracranial bleed. Doubt IIH. No indication for imaging. Likely tension vs sinus headache.  2. Rib pain Muscle strain from several weeks ago which is exacerbated due to his occupation.  The patient is  safe for discharge with strict return precautions.     Final Clinical Impressions(s) / ED Diagnoses   Final diagnoses:  Tension headache  Congestion of nasal sinus  Rib pain on left side   Disposition: Discharge  Condition: Good  I have discussed the results, Dx and Tx plan with the patient who expressed understanding and agree(s) with the plan. Discharge instructions discussed at great length. The patient was given strict return precautions who verbalized understanding of the instructions. No further questions at time of discharge.    Discharge Medication List as of 07/16/2016  9:22 PM      Follow Up: primary care provider  Schedule an appointment as soon as possible for a visit  As needed   I personally performed the services described in this documentation, which was scribed in my presence. The recorded information has been reviewed and is accurate.        Nira Conn, MD 07/17/16 [phone]

## 2016-07-16 NOTE — ED Notes (Signed)
ED Provider at bedside. 

## 2016-07-16 NOTE — ED Triage Notes (Signed)
Headache, facial pain, post nasal drip and left rib pain. No cough.

## 2016-08-11 ENCOUNTER — Emergency Department (HOSPITAL_COMMUNITY)
Admission: EM | Admit: 2016-08-11 | Discharge: 2016-08-11 | Disposition: A | Discharge status: Home / Self Care | Payer: Self-pay | Attending: Emergency Medicine | Admitting: Emergency Medicine

## 2016-08-11 ENCOUNTER — Encounter (HOSPITAL_COMMUNITY): Payer: Self-pay | Admitting: *Deleted

## 2016-08-11 DIAGNOSIS — R519 Headache, unspecified: Secondary | ICD-10-CM

## 2016-08-11 DIAGNOSIS — R0981 Nasal congestion: Secondary | ICD-10-CM | POA: Insufficient documentation

## 2016-08-11 DIAGNOSIS — Z87891 Personal history of nicotine dependence: Secondary | ICD-10-CM | POA: Insufficient documentation

## 2016-08-11 DIAGNOSIS — R51 Headache: Secondary | ICD-10-CM | POA: Insufficient documentation

## 2016-08-11 MED ORDER — LORATADINE 10 MG PO TABS: 10.0000 mg | ORAL_TABLET | Freq: Every day | ORAL | 0 refills | Status: DC

## 2016-08-11 MED ORDER — FLUTICASONE PROPIONATE 50 MCG/ACT NA SUSP: 2.0000 | Freq: Every day | NASAL | 0 refills | Status: DC

## 2016-08-11 NOTE — Discharge Instructions (Addendum)
Continue to stay well-hydrated. Continue to alternate between Tylenol and Ibuprofen for pain or fever. Use netipot and flonase to help with nasal congestion. Start taking claritin or other over the counter daily antihistamine to decrease secretions and for help with your symptoms. Limit your caffeine consumption. Follow up with the [facility] in 5-7 days for recheck of ongoing symptoms and to establish care. Return to emergency department for emergent changing or worsening of symptoms.

## 2016-08-11 NOTE — ED Triage Notes (Signed)
PT reports a HA for one week . Pain to LT side of head. Pt also reports congestion. PT reports he had green mucous.

## 2016-08-11 NOTE — ED Provider Notes (Signed)
[facility] Provider Note   CSN: [id number] Arrival date & time: 08/11/16  0901  By signing my name below, I, Doreatha MartinEva Mathews, attest that this documentation has been prepared under the direction and in the presence of  [address]Nevea Spiewak, VF CorporationPA-C. Electronically Signed: Doreatha MartinEva Mathews, ED Scribe. 08/11/16. 10:03 AM.    History   Chief Complaint Chief Complaint  Patient presents with  . Headache  . Facial Pain    HPI Timothy Stark is a 31 y.o. male who presents to the Emergency Department complaining of frontal HA x 1 month with associated sinus pressure, nasal congestion with clear mucous, and intermittent lightheadedness (none currently). Pt states his pain began after the initial onset of his nasal congestion 1.5 months ago. He was seen in the ED for the same complaint 3.5 weeks ago, had unremarkable exam and was d/c with instructions to use decongestants and OTC pain medication, which he states has helped. He googled his symptoms and became concerned so he came in for re-evaluation. He describes his HA as 5/10, intermittent nonradiating pressure sensation in his forehead and temples (L>R), with no known aggravating factors, and reports it is only mildly relieved by ibuprofen, Excedrin and sudafed. He admits to large caffeine consumption previously but has been trying to cut back. He denies fevers, chills, vision changes, ongoing lightheadedness, neck stiffness, sore throat, ear pain or drainage, visual changes, CP, SOB, abd pain, N/V/D/C, hematuria, dysuria, myalgias, arthralgias, numbness, tingling, focal weakness, or any other complaints at this time.   The history is provided by the patient and medical records. No language interpreter was used.  Headache   This is a recurrent problem. The current episode started more than 1 week ago. The problem occurs constantly. The problem has not changed since onset.The pain is located in the frontal and temporal region. Quality: pressure. The  pain is at a severity of 5/10. The pain is moderate. The pain radiates to the face. Pertinent negatives include no fever, no shortness of breath, no nausea and no vomiting. He has tried NSAIDs (ibuprofen, Excedrine, decongestants ) for the symptoms. The treatment provided mild relief.    History reviewed. No pertinent past medical history.  There are no active problems to display for this patient.   History reviewed. No pertinent surgical history.     Home Medications    Prior to Admission medications   Medication Sig Start Date End Date Taking? Authorizing Provider  ondansetron (ZOFRAN) 4 MG tablet Take 1 tablet (4 mg total) by mouth every 6 (six) hours. 08/03/15   Melene Planan Floyd, DO    Family History History reviewed. No pertinent family history.  Social History Social History  Substance Use Topics  . Smoking status: Former Smoker    Packs/day: 0.00  . Smokeless tobacco: Never Used  . Alcohol use Yes     Comment: occ     Allergies   Patient has no known allergies.   Review of Systems Review of Systems  Constitutional: Negative for chills and fever.  HENT: Positive for congestion and sinus pressure. Negative for ear discharge, ear pain and sore throat.   Eyes: Negative for visual disturbance.  Respiratory: Negative for shortness of breath.   Cardiovascular: Negative for chest pain.  Gastrointestinal: Negative for abdominal pain, constipation, diarrhea, nausea and vomiting.  Genitourinary: Negative for dysuria and hematuria.  Musculoskeletal: Negative for arthralgias, myalgias and neck stiffness.  Skin: Negative for color change.  Allergic/Immunologic: Negative for immunocompromised state.  Neurological: Positive for light-headedness (occasionally, none  currently ) and headaches. Negative for weakness and numbness.  Psychiatric/Behavioral: Negative for confusion.    A complete 10 system review of systems was obtained and all systems are negative except as noted in the  HPI and PMH.    Physical Exam Updated Vital Signs BP 126/87 (BP Location: Left Arm)   Pulse 95   Temp 98.3 F (36.8 C) (Oral)   Resp 18   Ht 5\' 9"  (1.753 m)   Wt 135 lb (61.2 kg)   SpO2 100%   BMI 19.94 kg/m   Physical Exam  Constitutional: He is oriented to person, place, and time. Vital signs are normal. He appears well-developed and well-nourished.  Non-toxic appearance. No distress.  Afebrile, nontoxic, NAD  HENT:  Head: Normocephalic and atraumatic.  Right Ear: Hearing, tympanic membrane, external ear and ear canal normal.  Left Ear: Hearing, tympanic membrane, external ear and ear canal normal.  Nose: Mucosal edema and rhinorrhea present.  Mouth/Throat: Uvula is midline, oropharynx is clear and moist and mucous membranes are normal. No trismus in the jaw. No uvula swelling. Tonsils are 0 on the right. Tonsils are 0 on the left. No tonsillar exudate.  Ears are clear bilaterally. Nose with mild mucosal edema and rhinorrhea. Oropharynx clear and moist, without uvular swelling or deviation, no trismus or drooling, no tonsillar swelling or erythema, no exudates.    Eyes: Conjunctivae and EOM are normal. Pupils are equal, round, and reactive to light. Right eye exhibits no discharge. Left eye exhibits no discharge.  PERRL, EOMI, no nystagmus, no visual field deficits   Neck: Normal range of motion. Neck supple. No spinous process tenderness and no muscular tenderness present. No neck rigidity. Normal range of motion present.  FROM intact without spinous process TTP, no bony stepoffs or deformities, no paraspinous muscle TTP or muscle spasms. No rigidity or meningeal signs. No bruising or swelling.   Cardiovascular: Normal rate and intact distal pulses.   Pulmonary/Chest: Effort normal. No respiratory distress.  Abdominal: Normal appearance. He exhibits no distension.  Musculoskeletal: Normal range of motion.  MAE x4 Strength and sensation grossly intact in all extremities Distal  pulses intact Gait steady  Neurological: He is alert and oriented to person, place, and time. He has normal strength. No cranial nerve deficit or sensory deficit. Coordination and gait normal. GCS eye subscore is 4. GCS verbal subscore is 5. GCS motor subscore is 6.  CN 2-12 grossly intact A&O x4 GCS 15 Sensation and strength intact Gait nonataxic including with tandem walking Coordination with finger-to-nose WNL Neg pronator drift   Skin: Skin is warm, dry and intact. No rash noted.  Psychiatric: He has a normal mood and affect.  Nursing note and vitals reviewed.    ED Treatments / Results   DIAGNOSTIC STUDIES: Oxygen Saturation is 100% on RA, normal by my interpretation.    COORDINATION OF CARE: 9:54 AM Discussed treatment plan with pt at bedside which includes Flonase, Neti pot and pt agreed to plan.    Labs (all labs ordered are listed, but only abnormal results are displayed) Labs Reviewed - No data to display  EKG  EKG Interpretation None       Radiology No results found.  Procedures Procedures (including critical care time)  Medications Ordered in ED Medications - No data to display   Initial Impression / Assessment and Plan / ED Course  I have reviewed the triage vital signs and the nursing notes.  Pertinent labs & imaging results that were available  during my care of the patient were reviewed by me and considered in my medical decision making (see chart for details).     31 y.o. male here with ongoing intermittent headaches and sinus congestion x1 month. No focal neuro deficits on exam, doubt secondary etiology of headache or need for further emergent testing. Mild congestion and rhinorrhea. Likely more allergic rhinitis related and less likely to be acute bacterial sinusitis. Will trial flonase, antihistamine, netipot; advised limiting caffeine intake, trying tylenol/motrin for relief. F/up with [facility] in 1wk for recheck of symptoms and to establish care.  I explained the diagnosis and have given explicit precautions to return to the ER including for any other new or worsening symptoms. The patient understands and accepts the medical plan as it's been dictated and I have answered their questions. Discharge instructions concerning home care and prescriptions have been given. The patient is STABLE and is discharged to home in good condition.   I personally performed the services described in this documentation, which was scribed in my presence. The recorded information has been reviewed and is accurate.   Final Clinical Impressions(s) / ED Diagnoses   Final diagnoses:  Sinus congestion  Chronic nonintractable headache, unspecified headache type    New Prescriptions New Prescriptions   FLUTICASONE (FLONASE) 50 MCG/ACT NASAL SPRAY    Place 2 sprays into both nostrils daily.   LORATADINE (CLARITIN) 10 MG TABLET    Take 1 tablet (10 mg total) by mouth daily.     [address] PA-C 08/11/16 1012    Alvira Monday, MD 08/12/16 1622

## 2017-03-19 ENCOUNTER — Emergency Department (HOSPITAL_BASED_OUTPATIENT_CLINIC_OR_DEPARTMENT_OTHER)
Admission: EM | Admit: 2017-03-19 | Discharge: 2017-03-19 | Disposition: A | Discharge status: Home / Self Care | Payer: Self-pay | Attending: Emergency Medicine | Admitting: Emergency Medicine

## 2017-03-19 ENCOUNTER — Encounter (HOSPITAL_BASED_OUTPATIENT_CLINIC_OR_DEPARTMENT_OTHER): Payer: Self-pay

## 2017-03-19 ENCOUNTER — Emergency Department (HOSPITAL_BASED_OUTPATIENT_CLINIC_OR_DEPARTMENT_OTHER): Payer: Self-pay

## 2017-03-19 DIAGNOSIS — Z87891 Personal history of nicotine dependence: Secondary | ICD-10-CM | POA: Insufficient documentation

## 2017-03-19 DIAGNOSIS — R109 Unspecified abdominal pain: Secondary | ICD-10-CM

## 2017-03-19 DIAGNOSIS — R1084 Generalized abdominal pain: Secondary | ICD-10-CM | POA: Insufficient documentation

## 2017-03-19 DIAGNOSIS — R110 Nausea: Secondary | ICD-10-CM | POA: Insufficient documentation

## 2017-03-19 LAB — CBC WITH DIFFERENTIAL/PLATELET
BASOS ABS: 0 10*3/uL (ref 0.0–0.1)
BASOS PCT: 0 %
EOS ABS: 0 10*3/uL (ref 0.0–0.7)
Eosinophils Relative: 0 %
HEMATOCRIT: 47 % (ref 39.0–52.0)
HEMOGLOBIN: 16.6 g/dL (ref 13.0–17.0)
Lymphocytes Relative: 24 %
Lymphs Abs: 1.7 10*3/uL (ref 0.7–4.0)
MCH: 29.9 pg (ref 26.0–34.0)
MCHC: 35.3 g/dL (ref 30.0–36.0)
MCV: 84.7 fL (ref 78.0–100.0)
Monocytes Absolute: 0.5 10*3/uL (ref 0.1–1.0)
Monocytes Relative: 7 %
NEUTROS ABS: 4.9 10*3/uL (ref 1.7–7.7)
NEUTROS PCT: 69 %
Platelets: 191 10*3/uL (ref 150–400)
RBC: 5.55 MIL/uL (ref 4.22–5.81)
RDW: 13.1 % (ref 11.5–15.5)
WBC: 7.2 10*3/uL (ref 4.0–10.5)

## 2017-03-19 LAB — COMPREHENSIVE METABOLIC PANEL
ALT: 36 U/L (ref 17–63)
AST: 23 U/L (ref 15–41)
Albumin: 4.2 g/dL (ref 3.5–5.0)
Alkaline Phosphatase: 50 U/L (ref 38–126)
Anion gap: 8 (ref 5–15)
BUN: 10 mg/dL (ref 6–20)
CO2: 28 mmol/L (ref 22–32)
Calcium: 9.2 mg/dL (ref 8.9–10.3)
Chloride: 103 mmol/L (ref 101–111)
Creatinine, Ser: 1.14 mg/dL (ref 0.61–1.24)
GFR calc Af Amer: >60 mL/min (ref >60)
GFR calc non Af Amer: >60 mL/min (ref >60)
Glucose, Bld: 98 mg/dL (ref 65–99)
Potassium: 3.8 mmol/L (ref 3.5–5.1)
Sodium: 139 mmol/L (ref 135–145)
Total Bilirubin: 1.2 mg/dL (ref 0.3–1.2)
Total Protein: 7.8 g/dL (ref 6.5–8.1)

## 2017-03-19 LAB — URINALYSIS, ROUTINE W REFLEX MICROSCOPIC
Bilirubin Urine: NEGATIVE
Glucose, UA: NEGATIVE mg/dL
Hgb urine dipstick: NEGATIVE
Ketones, ur: NEGATIVE mg/dL
Leukocytes, UA: NEGATIVE
Nitrite: NEGATIVE
Protein, ur: NEGATIVE mg/dL
Specific Gravity, Urine: 1.025 (ref 1.005–1.030)
pH: 6.5 (ref 5.0–8.0)

## 2017-03-19 MED ORDER — HYDROXYZINE HCL 25 MG PO TABS: 25.0000 mg | ORAL_TABLET | Freq: Three times a day (TID) | ORAL | 0 refills | Status: DC | PRN

## 2017-03-19 MED ORDER — IBUPROFEN 800 MG PO TABS: 800.0000 mg | ORAL_TABLET | Freq: Three times a day (TID) | ORAL | 0 refills | Status: DC | PRN

## 2017-03-19 MED ORDER — ONDANSETRON 4 MG PO TBDP: 4.0000 mg | ORAL_TABLET | Freq: Three times a day (TID) | ORAL | 0 refills | Status: DC | PRN

## 2017-03-19 MED ORDER — KETOROLAC TROMETHAMINE 30 MG/ML IJ SOLN
30.0000 mg | Freq: Once | INTRAMUSCULAR | Status: AC
  Administered 2017-03-19: 30 mg via INTRAVENOUS
  Filled 2017-03-19: qty 1

## 2017-03-19 MED ORDER — FAMOTIDINE 40 MG PO TABS: 40.0000 mg | ORAL_TABLET | Freq: Every day | ORAL | 0 refills | Status: DC

## 2017-03-19 MED FILL — FAMOTIDINE 40 MG TABLET: 40 | 30 days supply | Qty: 30 | Fill #0

## 2017-03-19 MED FILL — hydrOXYzine HCL 25 MG TABS: 25 | 4 days supply | Qty: 12 | Fill #0

## 2017-03-19 MED FILL — IBUPROFEN 800 MG TABS: 800 | 7 days supply | Qty: 21 | Fill #0

## 2017-03-19 NOTE — ED Triage Notes (Signed)
C/o left flank pain-started yesterday-states feels like pulled muscle-NAD-steady gait

## 2017-03-19 NOTE — ED Provider Notes (Signed)
Emergency Department Provider Note   I have reviewed the triage vital signs and the nursing notes.   HISTORY  Chief Complaint Flank Pain   HPI Timothy Stark is a 31 y.o. male with no significant past medical history presents to the emergency department for evaluation of left flank pain starting yesterday. 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 He reports going days without drinking water. He describes the pain as dull, pressure, soreness discomfort in the left flank with intermittent worsening. Not worse with movement. Patient states several months ago he went to the [place] and contracted an amoeba GI illness. He was treated with multiple antibiotics and again feeling better. He is concerned that this may be related to that infection but denies any nausea, vomiting, diarrhea. No fever or chills. No dysuria. No hematuria.    History reviewed. No pertinent past medical history.  There are no active problems to display for this patient.   History reviewed. No pertinent surgical history.    Allergies Patient has no known allergies.  No family history on file.  Social History Social History  Substance Use Topics  . Smoking status: Former Smoker    Packs/day: 0.00  . Smokeless tobacco: Never Used  . Alcohol use Yes     Comment: occ    Review of Systems  Constitutional: No fever/chills. Positive intermittent anxiety symptoms.  Eyes: No visual changes. ENT: No sore throat. Cardiovascular: Denies chest pain. Respiratory: Denies shortness of breath. Gastrointestinal: Positive occasional burning epigastric abdominal pain.  No nausea, no vomiting.  No diarrhea.  No constipation. Genitourinary: Negative for dysuria. Positive left flank pain. Denies hematuria.  Musculoskeletal: Negative for back pain. Skin: Negative for rash. Neurological: Negative for headaches, focal weakness or numbness.  10-point ROS otherwise  negative.  ____________________________________________   PHYSICAL EXAM:  VITAL SIGNS: ED Triage Vitals  Enc Vitals Group     BP 03/19/17 1341 140/86     Pulse Rate 03/19/17 1341 (!) 102     Resp 03/19/17 1341 18     Temp 03/19/17 1341 98.4 F (36.9 C)     Temp Source 03/19/17 1341 Oral     SpO2 03/19/17 1341 98 %     Weight 03/19/17 1340 155 lb 13.8 oz (70.7 kg)     Height 03/19/17 1340  (1.702 m)     Pain Score 03/19/17 1338 5   Constitutional: Alert and oriented. Well appearing and in no acute distress. Eyes: Conjunctivae are normal.  Head: Atraumatic. Nose: No congestion/rhinnorhea. Mouth/Throat: Mucous membranes are moist.  Neck: No stridor.   Cardiovascular: Normal rate, regular rhythm. Good peripheral circulation. Grossly normal heart sounds.   Respiratory: Normal respiratory effort.  No retractions. Lungs CTAB. Gastrointestinal: Soft and nontender. No distention. No CVA tenderness.  Musculoskeletal: No lower extremity tenderness nor edema. No gross deformities of extremities. Neurologic:  Normal speech and language. No gross focal neurologic deficits are appreciated.  Skin:  Skin is warm, dry and intact. No rash noted.  ____________________________________________   LABS (all labs ordered are listed, but only abnormal results are displayed)  Labs Reviewed  COMPREHENSIVE METABOLIC PANEL  CBC WITH DIFFERENTIAL/PLATELET  URINALYSIS, ROUTINE W REFLEX MICROSCOPIC   ____________________________________________  RADIOLOGY  Ct Renal Stone Study  Result Date: 03/19/2017 CLINICAL DATA:  Left flank pain since yesterday. EXAM: CT ABDOMEN AND PELVIS WITHOUT CONTRAST TECHNIQUE: Multidetector CT imaging of the abdomen and pelvis was performed following the standard protocol without IV contrast.  COMPARISON:  None. FINDINGS: Lower chest:  Unremarkable. Hepatobiliary: No focal abnormality in the liver on this study without intravenous contrast. Sludge noted in the  gallbladder lumen. No intrahepatic or extrahepatic biliary dilation. Pancreas: No focal mass lesion. No dilatation of the main duct. No intraparenchymal cyst. No peripancreatic edema. Spleen: No splenomegaly. No focal mass lesion. Adrenals/Urinary Tract: No adrenal nodule or mass. No stones are seen in either kidney. No secondary changes in either kidney or ureter. No ureteral stones. No bladder stones. Stomach/Bowel: Stomach is nondistended. No gastric wall thickening. No evidence of outlet obstruction. Duodenum is normally positioned as is the ligament of Treitz. No small bowel wall thickening. No small bowel dilatation. The terminal ileum is normal. The appendix is normal. No gross colonic mass. No colonic wall thickening. No substantial diverticular change. Vascular/Lymphatic: No abdominal aortic aneurysm. There is no gastrohepatic or hepatoduodenal ligament lymphadenopathy. No intraperitoneal or retroperitoneal lymphadenopathy. No pelvic sidewall lymphadenopathy. Reproductive: The prostate gland and seminal vesicles have normal imaging features. Other: No intraperitoneal free fluid. Musculoskeletal: Bone windows reveal no worrisome lytic or sclerotic osseous lesions. IMPRESSION: 1. No acute findings in the abdomen or pelvis. Specifically, no findings to explain the patient's history of left flank pain. Electronically Signed   By: Kennith Center M.D.   On: 03/19/2017 14:36    ____________________________________________   PROCEDURES  Procedure(s) performed:   Procedures  None ____________________________________________   INITIAL IMPRESSION / ASSESSMENT AND PLAN / ED COURSE  Pertinent labs & imaging results that were available during my care of the patient were reviewed by me and considered in my medical decision making (see chart for details).  Patient presents emergency department for evaluation of left flank pain. He has high risk for kidney stone. No abdominal tenderness on exam. No GU  symptoms. Plan for labs and reassessment after CT renal scan.   Patient feeling much better after Toradol. Labs, CT are unremarkable. He is describing some occasional epigastric burning pain most consistent with reflux disease. Plan to start Pepcid. Patient to take Motrin and Zofran as needed for flank symptoms. Suspect MSK etiology. Also prescribed Atarax to take as needed for sudden anxiety symptoms.   At this time, I do not feel there is any life-threatening condition present. I have reviewed and discussed all results (EKG, imaging, lab, urine as appropriate), exam findings with patient. I have reviewed nursing notes and appropriate previous records.  I feel the patient is safe to be discharged home without further emergent workup. Discussed usual and customary return precautions. Patient and family (if present) verbalize understanding and are comfortable with this plan.  Patient will follow-up with their primary care provider. If they do not have a primary care provider, information for follow-up has been provided to them. All questions have been answered.  ____________________________________________  FINAL CLINICAL IMPRESSION(S) / ED DIAGNOSES  Final diagnoses:  Left flank pain  Nausea     MEDICATIONS GIVEN DURING THIS VISIT:  Medications  ketorolac (TORADOL) 30 MG/ML injection 30 mg (30 mg Intravenous Given 03/19/17 1433)     NEW OUTPATIENT MEDICATIONS STARTED DURING THIS VISIT:  Discharge Medication List as of 03/19/2017  3:20 PM    START taking these medications   Details  famotidine (PEPCID) 40 MG tablet Take 1 tablet (40 mg total) by mouth at bedtime., Starting Wed 03/19/2017, Until Fri 04/18/2017, Print    hydrOXYzine (ATARAX/VISTARIL) 25 MG tablet Take 1 tablet (25 mg total) by mouth every 8 (eight) hours as needed for anxiety., Starting Wed  03/19/2017, Print    ibuprofen (ADVIL,MOTRIN) 800 MG tablet Take 1 tablet (800 mg total) by mouth every 8 (eight) hours as needed.,  Starting Wed 03/19/2017, Print    ondansetron (ZOFRAN ODT) 4 MG disintegrating tablet Take 1 tablet (4 mg total) by mouth every 8 (eight) hours as needed for nausea or vomiting., Starting Wed 03/19/2017, Print        Note:  This document was prepared using Dragon voice recognition software and may include unintentional dictation errors.  Alona Bene, MD Emergency Medicine    Rhaya Coale, Arlyss Repress, MD 03/19/17 [phone]

## 2017-03-19 NOTE — Discharge Instructions (Signed)
You have been seen in the Emergency Department (ED) for abdominal pain.  Your evaluation did not identify a clear cause of your symptoms but was generally reassuring.  Take th Motrin for pain as directed. The Zofran is for nausea and the Atarax is for any sudden anxiety symptoms. Take Pepcid daily to prevent heartburn.   Please follow up as instructed above regarding today?s emergent visit and the symptoms that are bothering you.  Return to the ED if your abdominal pain worsens or fails to improve, you develop bloody vomiting, bloody diarrhea, you are unable to tolerate fluids due to vomiting, fever greater than 101, or other symptoms that concern you.

## 2017-05-25 ENCOUNTER — Encounter (HOSPITAL_BASED_OUTPATIENT_CLINIC_OR_DEPARTMENT_OTHER): Payer: Self-pay | Admitting: Emergency Medicine

## 2017-05-25 ENCOUNTER — Emergency Department (HOSPITAL_BASED_OUTPATIENT_CLINIC_OR_DEPARTMENT_OTHER): Payer: Self-pay

## 2017-05-25 ENCOUNTER — Emergency Department (HOSPITAL_BASED_OUTPATIENT_CLINIC_OR_DEPARTMENT_OTHER)
Admission: EM | Admit: 2017-05-25 | Discharge: 2017-05-25 | Disposition: A | Discharge status: Home / Self Care | Payer: Self-pay | Attending: Emergency Medicine | Admitting: Emergency Medicine

## 2017-05-25 ENCOUNTER — Other Ambulatory Visit: Payer: Self-pay

## 2017-05-25 DIAGNOSIS — R202 Paresthesia of skin: Secondary | ICD-10-CM | POA: Insufficient documentation

## 2017-05-25 DIAGNOSIS — R072 Precordial pain: Secondary | ICD-10-CM | POA: Insufficient documentation

## 2017-05-25 DIAGNOSIS — R51 Headache: Secondary | ICD-10-CM | POA: Insufficient documentation

## 2017-05-25 DIAGNOSIS — Z87891 Personal history of nicotine dependence: Secondary | ICD-10-CM | POA: Insufficient documentation

## 2017-05-25 DIAGNOSIS — R5383 Other fatigue: Secondary | ICD-10-CM | POA: Insufficient documentation

## 2017-05-25 LAB — COMPREHENSIVE METABOLIC PANEL
ALT: 20 U/L (ref 17–63)
ANION GAP: 10 (ref 5–15)
AST: 19 U/L (ref 15–41)
Albumin: 4.4 g/dL (ref 3.5–5.0)
Alkaline Phosphatase: 51 U/L (ref 38–126)
BUN: 11 mg/dL (ref 6–20)
CHLORIDE: 103 mmol/L (ref 101–111)
CO2: 25 mmol/L (ref 22–32)
CREATININE: 1.1 mg/dL (ref 0.61–1.24)
Calcium: 9.5 mg/dL (ref 8.9–10.3)
Glucose, Bld: 92 mg/dL (ref 65–99)
POTASSIUM: 3.5 mmol/L (ref 3.5–5.1)
Sodium: 138 mmol/L (ref 135–145)
Total Bilirubin: 0.7 mg/dL (ref 0.3–1.2)
Total Protein: 7.9 g/dL (ref 6.5–8.1)

## 2017-05-25 LAB — CBC WITH DIFFERENTIAL/PLATELET
Basophils Absolute: 0 10*3/uL (ref 0.0–0.1)
Basophils Relative: 0 %
EOS ABS: 0.1 10*3/uL (ref 0.0–0.7)
Eosinophils Relative: 1 %
HCT: 48.5 % (ref 39.0–52.0)
HEMOGLOBIN: 16.7 g/dL (ref 13.0–17.0)
LYMPHS ABS: 2.7 10*3/uL (ref 0.7–4.0)
LYMPHS PCT: 36 %
MCH: 29.4 pg (ref 26.0–34.0)
MCHC: 34.4 g/dL (ref 30.0–36.0)
MCV: 85.4 fL (ref 78.0–100.0)
Monocytes Absolute: 0.6 10*3/uL (ref 0.1–1.0)
Monocytes Relative: 8 %
NEUTROS ABS: 4.1 10*3/uL (ref 1.7–7.7)
NEUTROS PCT: 55 %
Platelets: 198 10*3/uL (ref 150–400)
RBC: 5.68 MIL/uL (ref 4.22–5.81)
RDW: 12.6 % (ref 11.5–15.5)
WBC: 7.5 10*3/uL (ref 4.0–10.5)

## 2017-05-25 LAB — TROPONIN I

## 2017-05-25 LAB — CBG MONITORING, ED: Glucose-Capillary: 83 mg/dL (ref 65–99)

## 2017-05-25 MED ORDER — SODIUM CHLORIDE 0.9 % IV BOLUS (SEPSIS)
1000.0000 mL | Freq: Once | INTRAVENOUS | Status: AC
  Administered 2017-05-25: 1000 mL via INTRAVENOUS

## 2017-05-25 NOTE — ED Triage Notes (Addendum)
Pt reports "not feeling right". Reports fatigue, decreased appetite x 2 weeks, intermittent chest pain, dizziness, numbness to face. Seen at [place] office in [place] earlier this week with neg work up.

## 2017-05-25 NOTE — ED Notes (Signed)
Patient transported to X-ray 

## 2017-05-25 NOTE — ED Provider Notes (Signed)
Emergency Department Provider Note   I have reviewed the triage vital signs and the nursing notes.   HISTORY  Chief Complaint fatigue   HPI Timothy Stark is a 31 y.o. male presents to the emergency department for evaluation of decreased appetite, fatigue, intermittent left face numbness.  Patient also has intermittent chest discomfort.  He states he has had tingling in the face and chest discomfort today but no chest pain currently.  Denies fevers or chills.  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  No changes to medications.  He was seen in the emergency department in September for flank pain which is no longer having.  At that time he was prescribed Atarax for underlying anxiety symptoms.  He states he took that once and did not get significant relief from it.  He denies dyspnea.  No numbness or tingling in the arms or legs.  No weakness.  No difficulty walking.  History reviewed. No pertinent past medical history.  There are no active problems to display for this patient.   History reviewed. No pertinent surgical history.  Current Outpatient Rx  . Order #: [id number] Class: Print  . Order #: [id number] Class: Print  . Order #: [id number] Class: Print  . Order #: [id number] Class: Print    Allergies Patient has no known allergies.  No family history on file.  Social History Social History   Tobacco Use  . Smoking status: Former Smoker    Packs/day: 0.00  . Smokeless tobacco: Never Used  Substance Use Topics  . Alcohol use: Yes    Comment: occ  . Drug use: No    Review of Systems  Constitutional: No fever/chills. Positive generalized fatigue.  Eyes: No visual changes. ENT: No sore throat. Cardiovascular: Positive chest pain. Respiratory: Denies shortness of breath. Gastrointestinal: No abdominal pain.  No nausea, no vomiting.  No diarrhea.  No constipation. Genitourinary: Negative for  dysuria. Musculoskeletal: Negative for back pain. Skin: Negative for rash. Neurological: Negative for headaches, focal weakness or numbness. Positive intermittent face paresthesias.   10-point ROS otherwise negative.  ____________________________________________   PHYSICAL EXAM:  VITAL SIGNS: ED Triage Vitals  Enc Vitals Group     BP 05/25/17 1619 (!) 138/93     Pulse Rate 05/25/17 1619 93     Resp 05/25/17 1619 18     Temp 05/25/17 1619 98.1 F (36.7 C)     Temp Source 05/25/17 1619 Oral     SpO2 05/25/17 1619 100 %     Pain Score 05/25/17 1618 0   Constitutional: Alert and oriented. Well appearing and in no acute distress. Eyes: Conjunctivae are normal. PERRL. EOMI. Head: Atraumatic. Nose: No congestion/rhinnorhea. Mouth/Throat: Mucous membranes are moist.  Oropharynx non-erythematous. Neck: No stridor.   Cardiovascular: Normal rate, regular rhythm. Good peripheral circulation. Grossly normal heart sounds.   Respiratory: Normal respiratory effort.  No retractions. Lungs CTAB. Gastrointestinal: Soft and nontender. No distention.  Musculoskeletal: No lower extremity tenderness nor edema. No gross deformities of extremities. Neurologic:  Normal speech and language. No gross focal neurologic deficits are appreciated. Normal CN exam 2-12. Normal upper/lower extremity strength/sensation. No pronator drift. Normal gait.  Skin:  Skin is warm, dry and intact. No rash noted. Psychiatric: Mood and affect are normal. Speech and behavior are normal. Patient appears slightly anxious.  ____________________________________________   LABS (all labs ordered are listed, but only abnormal results are displayed)  Labs Reviewed  COMPREHENSIVE  METABOLIC PANEL  CBC WITH DIFFERENTIAL/PLATELET  TROPONIN I  CBG MONITORING, ED   ____________________________________________  EKG   EKG Interpretation  Date/Time:  Sunday May 25 2017 16:19:22 EST Ventricular Rate:  88 PR  Interval:  132 QRS Duration: 86 QT Interval:  334 QTC Calculation: 404 R Axis:   89 Text Interpretation:  Normal sinus rhythm Normal ECG No STEMI.  Confirmed by Alona BeneLong, Aylen Stradford [phone]) on 05/25/2017 4:32:26 PM       ____________________________________________  RADIOLOGY  Dg Chest 2 View  Result Date: 05/25/2017 CLINICAL DATA:  Intermittent chest pain, right-sided facial numbness with headache EXAM: CHEST  2 VIEW COMPARISON:  11/03/2011 FINDINGS: The heart size and mediastinal contours are within normal limits. Both lungs are clear. The visualized skeletal structures are unremarkable. IMPRESSION: No active cardiopulmonary disease. Electronically Signed   By: Jasmine PangKim  Fujinaga M.D.   On: 05/25/2017 18:28   Ct Head Wo Contrast  Result Date: 05/25/2017 CLINICAL DATA:  Left-sided facial tingling with headaches and decreased appetite EXAM: CT HEAD WITHOUT CONTRAST TECHNIQUE: Contiguous axial images were obtained from the base of the skull through the vertex without intravenous contrast. COMPARISON:  None. FINDINGS: Brain: No evidence of acute infarction, hemorrhage, hydrocephalus, extra-axial collection or mass lesion/mass effect. Vascular: No hyperdense vessel or unexpected calcification. Skull: Normal. Negative for fracture or focal lesion. Sinuses/Orbits: Mild mucosal thickening in the ethmoid, and maxillary sinus. Other: None IMPRESSION: No CT evidence for acute intracranial abnormality. Electronically Signed   By: Jasmine PangKim  Fujinaga M.D.   On: 05/25/2017 18:31    ____________________________________________   PROCEDURES  Procedure(s) performed:   Procedures  None ____________________________________________   INITIAL IMPRESSION / ASSESSMENT AND PLAN / ED COURSE  Pertinent labs & imaging results that were available during my care of the patient were reviewed by me and considered in my medical decision making (see chart for details).  Patient presents emergency department for evaluation  of decreased appetite, fatigue, left face tingling, intermittent chest discomfort.  The above symptoms are intermittent and chronic.  Patient does appear somewhat anxious.  He has no focal neurological deficits.  Given his tingling sensation plan to obtain CT head to rule out large space-occupying lesion or evidence of old infarct but have extremely low suspicion for either of these diagnoses.   Imaging, labs, and EKG reviewed with no acute findings. Advised Atarax PRN for anxiety symptoms and stressed importance of establishing care with PCP for further fatigue w/u. Verbalizes understanding. Contact info provided at discharge.   At this time, I do not feel there is any life-threatening condition present. I have reviewed and discussed all results (EKG, imaging, lab, urine as appropriate), exam findings with patient. I have reviewed nursing notes and appropriate previous records.  I feel the patient is safe to be discharged home without further emergent workup. Discussed usual and customary return precautions. Patient and family (if present) verbalize understanding and are comfortable with this plan.  Patient will follow-up with their primary care provider. If they do not have a primary care provider, information for follow-up has been provided to them. All questions have been answered.  ____________________________________________  FINAL CLINICAL IMPRESSION(S) / ED DIAGNOSES  Final diagnoses:  Precordial chest pain  Fatigue, unspecified type  Paresthesias     MEDICATIONS GIVEN DURING THIS VISIT:  Medications  sodium chloride 0.9 % bolus 1,000 mL (0 mLs Intravenous Stopped 05/25/17 1916)    Note:  This document was prepared using Dragon voice recognition software and may include unintentional dictation errors.  Alona BeneJoshua Cari Burgo, MD Emergency Medicine    Khristy Kalan, Arlyss RepressJoshua G, MD 05/26/17 [phone]

## 2017-05-25 NOTE — Discharge Instructions (Signed)

## 2017-05-25 NOTE — ED Notes (Addendum)
Pt reports decreased appetite, fatigue. Pt reports that sometimes the L side of his face feels numb. Pt also endorses occasional L flank.

## 2017-06-13 ENCOUNTER — Ambulatory Visit: Payer: Self-pay | Attending: Family Medicine | Admitting: Family Medicine

## 2017-06-13 ENCOUNTER — Encounter: Payer: Self-pay | Admitting: Family Medicine

## 2017-06-13 ENCOUNTER — Other Ambulatory Visit: Payer: Self-pay

## 2017-06-13 VITALS — BP 141/86 | HR 83 | Temp 97.6°F | Resp 18 | Wt 154.0 lb

## 2017-06-13 DIAGNOSIS — R630 Anorexia: Secondary | ICD-10-CM | POA: Insufficient documentation

## 2017-06-13 DIAGNOSIS — F419 Anxiety disorder, unspecified: Secondary | ICD-10-CM

## 2017-06-13 DIAGNOSIS — G4700 Insomnia, unspecified: Secondary | ICD-10-CM | POA: Insufficient documentation

## 2017-06-13 DIAGNOSIS — Z79899 Other long term (current) drug therapy: Secondary | ICD-10-CM | POA: Insufficient documentation

## 2017-06-13 DIAGNOSIS — Z791 Long term (current) use of non-steroidal anti-inflammatories (NSAID): Secondary | ICD-10-CM | POA: Insufficient documentation

## 2017-06-13 DIAGNOSIS — Z818 Family history of other mental and behavioral disorders: Secondary | ICD-10-CM | POA: Insufficient documentation

## 2017-06-13 DIAGNOSIS — F418 Other specified anxiety disorders: Secondary | ICD-10-CM | POA: Insufficient documentation

## 2017-06-13 DIAGNOSIS — R079 Chest pain, unspecified: Secondary | ICD-10-CM | POA: Insufficient documentation

## 2017-06-13 NOTE — Progress Notes (Signed)
Subjective:  Patient ID: Timothy Stark, male    DOB: 03/15/1986  Age: 31 y.o. MRN: [id number]  CC: Establish Care   HPI Timothy Stark presents to establish care. He complains of anxious mood.  He has the following symptoms: chest pain, feelings of losing control, insomnia, paresthesias, racing thoughts, poor appetite, and hot flashes. He states " I guess I was having a panic attack". He reports two previous visit to the urgent care for similar symptoms. He denies any CP since last urgent care visit. He denies any changes to bowel or bladder habits. Onset of symptoms was approximately several months ago, gradually worsening since that time. He denies current suicidal and homicidal ideation. Family history significant for anxiety.Possible organic causes contributing are: none. Risk factors: family history of anxiety disorder. Stressors: none. He works as a Naval architecttruck driver. Previous treatment includes Atarax prn .  He complains of the following side effects from the treatment: none. He reports not wanting to taking medication for his symptoms and discontinued on his own. He declines to speak with the LCSW or referral at this time. He is agreeable to receiving counseling resource information.    Outpatient Medications Prior to Visit  Medication Sig Dispense Refill  . famotidine (PEPCID) 40 MG tablet Take 1 tablet (40 mg total) by mouth at bedtime. 30 tablet 0  . hydrOXYzine (ATARAX/VISTARIL) 25 MG tablet Take 1 tablet (25 mg total) by mouth every 8 (eight) hours as needed for anxiety. (Patient not taking: Reported on 06/13/2017) 12 tablet 0  . ibuprofen (ADVIL,MOTRIN) 800 MG tablet Take 1 tablet (800 mg total) by mouth every 8 (eight) hours as needed. (Patient not taking: Reported on 06/13/2017) 21 tablet 0  . ondansetron (ZOFRAN ODT) 4 MG disintegrating tablet Take 1 tablet (4 mg total) by mouth every 8 (eight) hours as needed for nausea or vomiting. (Patient not taking: Reported on  06/13/2017) 20 tablet 0   No facility-administered medications prior to visit.     ROS Review of Systems  Constitutional: Negative.   Respiratory: Negative.   Cardiovascular: Negative.   Gastrointestinal: Negative.   Psychiatric/Behavioral: Negative for suicidal ideas. The patient is nervous/anxious.    Objective:  BP (!) 141/86 (BP Location: Left Arm, Patient Position: Sitting, Cuff Size: Normal)   Pulse 83   Temp 97.6 F (36.4 C) (Oral)   Resp 18   Wt 154 lb (69.9 kg)   SpO2 97%   BMI 24.12 kg/m   BP/Weight 06/13/2017 05/25/2017 03/19/2017  Systolic BP 141 149 122  Diastolic BP 86 76 77  Wt. (Lbs) 154 - 155.87  BMI 24.12 - 24.41   Physical Exam  Constitutional: He appears well-developed and well-nourished.  HENT:  Head: Normocephalic and atraumatic.  Right Ear: External ear normal.  Left Ear: External ear normal.  Nose: Nose normal.  Mouth/Throat: Oropharynx is clear and moist.  Eyes: Conjunctivae are normal. Pupils are equal, round, and reactive to light.  Neck: Normal range of motion. Neck supple. No thyromegaly present.  Cardiovascular: Normal rate, regular rhythm, normal heart sounds and intact distal pulses.  Pulmonary/Chest: Effort normal and breath sounds normal.  Abdominal: Soft. Bowel sounds are normal. There is no tenderness.  Skin: Skin is warm and dry.  Psychiatric: His mood appears anxious. He expresses no homicidal and no suicidal ideation. He expresses no suicidal plans and no homicidal plans.  Nursing note and vitals reviewed.    Assessment & Plan:   1. Anxious mood Labs to rule out possible causes.  Given counseling Visual merchandiserresource information materials. - Thyroid Panel With TSH; Future - Vitamin D, 25-hydroxy - Thyroid Panel With TSH  2. Decreased appetite -Encouraged eating 5 to 6 small snacks or meals per day.  - Thyroid Panel With TSH; Future      Follow-up: Return in about 8 weeks (around 08/08/2017) for Anxiety.   Lizbeth BarkMandesia R Kianah Harries  FNP

## 2017-06-13 NOTE — Progress Notes (Signed)
Patient is here for establish care   Patient complains about having anxiety stress loss appetite

## 2017-06-13 NOTE — Patient Instructions (Signed)

## 2017-06-14 LAB — VITAMIN D 25 HYDROXY (VIT D DEFICIENCY, FRACTURES): VIT D 25 HYDROXY: 18.7 ng/mL — AB (ref 30.0–100.0)

## 2017-06-14 LAB — THYROID PANEL WITH TSH
FREE THYROXINE INDEX: 2.3 (ref 1.2–4.9)
T3 UPTAKE RATIO: 28 % (ref 24–39)
T4 TOTAL: 8.3 ug/dL (ref 4.5–12.0)
TSH: 0.675 u[IU]/mL (ref 0.450–4.500)

## 2017-06-19 ENCOUNTER — Other Ambulatory Visit: Payer: Self-pay | Admitting: Family Medicine

## 2017-06-19 DIAGNOSIS — E559 Vitamin D deficiency, unspecified: Secondary | ICD-10-CM

## 2017-06-19 MED ORDER — VITAMIN D (ERGOCALCIFEROL) 1.25 MG (50000 UNIT) PO CAPS: ORAL_CAPSULE | ORAL | 0 refills | Status: DC

## 2017-06-20 ENCOUNTER — Telehealth: Payer: Self-pay | Admitting: *Deleted

## 2017-06-20 ENCOUNTER — Telehealth: Payer: Self-pay | Admitting: Family Medicine

## 2017-06-20 NOTE — Telephone Encounter (Signed)
Medical Assistant left message on patient's home and cell voicemail. Voicemail states to give a call [place] [facility] at [phone]. !!!Please inform patient of his vitamin d level being low. A supplement was prescribed to the pharmacy. Thyroid function is normal!!!

## 2017-06-20 NOTE — Telephone Encounter (Signed)
-----   Message from Lizbeth BarkMandesia R Hairston, FNP sent at 06/19/2017  1:35 PM EST ----- Thyroid function normal Vitamin D level was low. You were prescribed ergocalciferol (capsules) to increase your vitamin-d level. Once finished start taking OTC vitamin d supplement with 400 international units (IU) of vitamin-d per day.

## 2017-06-20 NOTE — Telephone Encounter (Signed)
Pt called to get the result for the lab, per nurse I was able to inform of the result,

## 2017-08-01 ENCOUNTER — Emergency Department (HOSPITAL_BASED_OUTPATIENT_CLINIC_OR_DEPARTMENT_OTHER)
Admission: EM | Admit: 2017-08-01 | Discharge: 2017-08-02 | Disposition: A | Discharge status: Home / Self Care | Payer: Self-pay | Attending: Emergency Medicine | Admitting: Emergency Medicine

## 2017-08-01 ENCOUNTER — Encounter (HOSPITAL_BASED_OUTPATIENT_CLINIC_OR_DEPARTMENT_OTHER): Payer: Self-pay

## 2017-08-01 ENCOUNTER — Other Ambulatory Visit: Payer: Self-pay

## 2017-08-01 ENCOUNTER — Emergency Department (HOSPITAL_BASED_OUTPATIENT_CLINIC_OR_DEPARTMENT_OTHER): Payer: Self-pay

## 2017-08-01 DIAGNOSIS — F17210 Nicotine dependence, cigarettes, uncomplicated: Secondary | ICD-10-CM | POA: Insufficient documentation

## 2017-08-01 DIAGNOSIS — R0789 Other chest pain: Secondary | ICD-10-CM | POA: Insufficient documentation

## 2017-08-01 DIAGNOSIS — F419 Anxiety disorder, unspecified: Secondary | ICD-10-CM | POA: Insufficient documentation

## 2017-08-01 HISTORY — DX: Anxiety disorder, unspecified: F41.9

## 2017-08-01 LAB — CBC
HEMATOCRIT: 46.8 % (ref 39.0–52.0)
HEMOGLOBIN: 16.2 g/dL (ref 13.0–17.0)
MCH: 29.2 pg (ref 26.0–34.0)
MCHC: 34.6 g/dL (ref 30.0–36.0)
MCV: 84.5 fL (ref 78.0–100.0)
Platelets: 191 10*3/uL (ref 150–400)
RBC: 5.54 MIL/uL (ref 4.22–5.81)
RDW: 12.9 % (ref 11.5–15.5)
WBC: 8.5 10*3/uL (ref 4.0–10.5)

## 2017-08-01 LAB — BASIC METABOLIC PANEL
ANION GAP: 9 (ref 5–15)
BUN: 14 mg/dL (ref 6–20)
CALCIUM: 9.3 mg/dL (ref 8.9–10.3)
CO2: 26 mmol/L (ref 22–32)
Chloride: 103 mmol/L (ref 101–111)
Creatinine, Ser: 1.03 mg/dL (ref 0.61–1.24)
GLUCOSE: 99 mg/dL (ref 65–99)
POTASSIUM: 3.5 mmol/L (ref 3.5–5.1)
Sodium: 138 mmol/L (ref 135–145)

## 2017-08-01 NOTE — ED Notes (Signed)
Alert, NAD, calm, interactive, resps e/u, speaking in clear complete sentences, no dyspnea noted, skin W&D, VSS, c/o L axillary rib/CP, comes and goes, (denies: sob, NV, dizziness or visual changes). 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 Also admits to h/o anxiety, took atarax PTA. Steady gait up to b/r.

## 2017-08-01 NOTE — ED Triage Notes (Signed)
C/o CP x 2 weeks-NAD-steady gait 

## 2017-08-02 ENCOUNTER — Encounter (HOSPITAL_BASED_OUTPATIENT_CLINIC_OR_DEPARTMENT_OTHER): Payer: Self-pay | Admitting: Emergency Medicine

## 2017-08-02 LAB — D-DIMER, QUANTITATIVE (NOT AT ARMC): D DIMER QUANT: 0.34 ug{FEU}/mL (ref 0.00–0.50)

## 2017-08-02 LAB — TROPONIN I: Troponin I: <0.03 ng/mL (ref <0.03)

## 2017-08-02 NOTE — ED Notes (Signed)
ED Provider at bedside. 

## 2017-08-02 NOTE — ED Notes (Signed)
HR 94-102, alert, NAD, calm, "feeling better", resps e/u, no dyspnea, skin W&D, talking with son via phone.

## 2017-08-02 NOTE — ED Notes (Signed)
Pt verbalizes understanding of d/c instructions and denies any further needs at this time. 

## 2017-08-02 NOTE — ED Provider Notes (Signed)
[facility] Provider Note: Lowella Dell, MD, FACEP  CSN: [id number] MRN: [id number] ARRIVAL: 08/01/17 at 2213 ROOM: [facility]03/[facility]03   CHIEF COMPLAINT  Chest Pain   HISTORY OF PRESENT ILLNESS  08/02/17 12:11 AM Timothy Stark is a 32 y.o. male with a history of anxiety.  He is here with a 2-week history of intermittent left-sided chest discomfort.  He cannot describe what the discomfort is like.  He states it is worse when he squeezes the muscles on the left side of his chest.  He is not sure if the sensation comes from his chest or his arm.  He rates his pain as a 5 out of 10. 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  There has been no associated shortness of breath, nausea, vomiting or diaphoresis.  He does not know what brings the pain on or makes it go away.  While waiting in the ED the patient became anxious and his heart rate went up as high as 160.  An EKG shows sinus tachycardia.  He has had an outpatient workup for previous chest x-ray which included thyroid test which were reportedly normal.  His chest pain was attributed to anxiety and he was placed on hydroxyzine.  He does not believe hydroxyzine is adequately treating his anxiety.   Past Medical History:  Diagnosis Date  . Anxiety     History reviewed. No pertinent surgical history.  No family history on file.  Social History   Tobacco Use  . Smoking status: Current Every Day Smoker    Packs/day: 0.00    Types: E-cigarettes  . Smokeless tobacco: Never Used  Substance Use Topics  . Alcohol use: Yes    Comment: occ  . Drug use: No    Prior to Admission medications   Medication Sig Start Date End Date Taking? Authorizing Provider  hydrOXYzine (ATARAX/VISTARIL) 25 MG tablet Take 1 tablet (25 mg total) by mouth every 8 (eight) hours as needed for anxiety. Patient not taking: Reported on 06/13/2017 03/19/17   Long, Arlyss Repress, MD     Allergies Patient has no known allergies.   REVIEW OF SYSTEMS  Negative except as noted here or in the History of Present Illness.   PHYSICAL EXAMINATION  Initial Vital Signs Blood pressure 123/85, pulse 93, temperature 98.5 F (36.9 C), temperature source Oral, resp. rate 12, weight 68.3 kg (150 lb 9.2 oz), SpO2 98 %.  Examination General: Well-developed, well-nourished male in no acute distress; appearance consistent with age of record HENT: normocephalic; atraumatic Eyes: pupils equal, round and reactive to light; extraocular muscles intact Neck: supple Heart: regular rate and rhythm; tachycardia Lungs: clear to auscultation bilaterally Abdomen: soft; nondistended; nontender; bowel sounds present Extremities: No deformity; full range of motion; pulses normal Neurologic: Awake, alert and oriented; motor function intact in all extremities and symmetric; no facial droop; tremulous Skin: Warm and dry Psychiatric: Anxious   RESULTS  Summary of this visit's results, reviewed by myself:   EKG Interpretation  Date/Time:  Friday August 01 2017 22:21:28 EST Ventricular Rate:  96 PR Interval:  130 QRS Duration: 86 QT Interval:  332 QTC Calculation: 419 R Axis:   98 Text Interpretation:  Normal sinus rhythm Rightward axis Abnormal ECG No significant change was found Confirmed by Paula Libra ([id number]) on 08/02/2017 12:11:15 AM       EKG Interpretation  Date/Time:  Saturday August 02 2017 00:12:06 EST Ventricular  Rate:  139 PR Interval:  130 QRS Duration: 78 QT Interval:  297 QTC Calculation: 452 R Axis:   102 Text Interpretation:  Sinus tachycardia Right axis deviation Borderline repolarization abnormality Rate is faster Confirmed by Wasil Wolke, Jonny RuizJohn ([id number]) on 08/02/2017 12:29:09 AM       Laboratory Studies: Results for orders placed or performed during the hospital encounter of 08/01/17 (from the past 24 hour(s))  Basic metabolic panel     Status: None    Collection Time: 08/01/17 11:40 PM  Result Value Ref Range   Sodium 138 135 - 145 mmol/L   Potassium 3.5 3.5 - 5.1 mmol/L   Chloride 103 101 - 111 mmol/L   CO2 26 22 - 32 mmol/L   Glucose, Bld 99 65 - 99 mg/dL   BUN 14 6 - 20 mg/dL   Creatinine, Ser 1.911.03 0.61 - 1.24 mg/dL   Calcium 9.3 8.9 - 47.810.3 mg/dL   GFR calc non Af Amer >60 >60 mL/min   GFR calc Af Amer >60 >60 mL/min   Anion gap 9 5 - 15  CBC     Status: None   Collection Time: 08/01/17 11:40 PM  Result Value Ref Range   WBC 8.5 4.0 - 10.5 K/uL   RBC 5.54 4.22 - 5.81 MIL/uL   Hemoglobin 16.2 13.0 - 17.0 g/dL   HCT 29.546.8 62.139.0 - 30.852.0 %   MCV 84.5 78.0 - 100.0 fL   MCH 29.2 26.0 - 34.0 pg   MCHC 34.6 30.0 - 36.0 g/dL   RDW 65.712.9 84.611.5 - 96.215.5 %   Platelets 191 150 - 400 K/uL  Troponin I     Status: None   Collection Time: 08/01/17 11:40 PM  Result Value Ref Range   Troponin I <0.03 <0.03 ng/mL  D-dimer, quantitative (not at [facility])     Status: None   Collection Time: 08/01/17 11:40 PM  Result Value Ref Range   D-Dimer, Quant 0.34 0.00 - 0.50 ug/mL-FEU   Imaging Studies: Dg Chest 2 View  Result Date: 08/02/2017 CLINICAL DATA:  Left-sided chest pain and left arm pain for 2 weeks. Previous smoker. EXAM: CHEST  2 VIEW COMPARISON:  05/25/2017 FINDINGS: Pulmonary hyperinflation. Normal heart size and pulmonary vascularity. No focal airspace disease or consolidation in the lungs. No blunting of costophrenic angles. No pneumothorax. Mediastinal contours appear intact. IMPRESSION: No active cardiopulmonary disease. Electronically Signed   By: Burman NievesWilliam  Stevens M.D.   On: 08/02/2017 01:50    ED COURSE  Nursing notes and initial vitals signs, including pulse oximetry, reviewed.  Vitals:   08/02/17 0030 08/02/17 0035 08/02/17 0045 08/02/17 0100  BP:    123/87  Pulse: (!) 109 95 95 92  Resp: 17 (!) 21 13 13   Temp:      TempSrc:      SpO2: 97% 97% 96% 98%  Weight:       The patient's chest discomfort is vague and does not sound  consistent with cardiac etiology.  The patient had what was most likely a panic attack while in the ED.  He admits to feeling anxiety and palpitations during similar episodes.  He has an appointment pending with his PCP and he will address this issue further at that appointment.  PROCEDURES    ED DIAGNOSES     ICD-10-CM   1. Anxiety F41.9   2. Atypical chest pain R07.89        Paula LibraMolpus, Alexandrya Chim, MD 08/02/17 [phone]

## 2017-08-02 NOTE — ED Notes (Addendum)
Episode of tachycardia, HR reported up to 160s. Reports not feeling right just prior to episode. Rate captured on monitor. EDP notified. Strips printed and placed on chart.

## 2017-08-08 ENCOUNTER — Ambulatory Visit: Payer: Self-pay | Attending: Family Medicine | Admitting: Family Medicine

## 2017-08-08 ENCOUNTER — Encounter: Payer: Self-pay | Admitting: Family Medicine

## 2017-08-08 ENCOUNTER — Ambulatory Visit: Payer: Self-pay | Attending: Family Medicine | Admitting: Licensed Clinical Social Worker

## 2017-08-08 VITALS — BP 142/88 | HR 90 | Temp 98.1°F | Resp 16 | Ht 68.0 in | Wt 151.2 lb

## 2017-08-08 DIAGNOSIS — Z8659 Personal history of other mental and behavioral disorders: Secondary | ICD-10-CM

## 2017-08-08 DIAGNOSIS — F419 Anxiety disorder, unspecified: Secondary | ICD-10-CM

## 2017-08-08 DIAGNOSIS — Z818 Family history of other mental and behavioral disorders: Secondary | ICD-10-CM | POA: Insufficient documentation

## 2017-08-08 MED ORDER — CITALOPRAM HYDROBROMIDE 20 MG PO TABS: 20.0000 mg | ORAL_TABLET | Freq: Every day | ORAL | 2 refills | Status: DC

## 2017-08-08 NOTE — Patient Instructions (Signed)
Living With Anxiety After being diagnosed with an anxiety disorder, you may be relieved to know why you have felt or behaved a certain way. It is natural to also feel overwhelmed about the treatment ahead and what it will mean for your life. With care and support, you can manage this condition and recover from it. How to cope with anxiety Dealing with stress Stress is your body's reaction to life changes and events, both good and bad. Stress can last just a few hours or it can be ongoing. Stress can play a major role in anxiety, so it is important to learn both how to cope with stress and how to think about it differently. Talk with your health care provider or a counselor to learn more about stress reduction. He or she may suggest some stress reduction techniques, such as:  Music therapy. This can include creating or listening to music that you enjoy and that inspires you.  Mindfulness-based meditation. This involves being aware of your normal breaths, rather than trying to control your breathing. It can be done while sitting or walking.  Centering prayer. This is a kind of meditation that involves focusing on a word, phrase, or sacred image that is meaningful to you and that brings you peace.  Deep breathing. To do this, expand your stomach and inhale slowly through your nose. Hold your breath for 3-5 seconds. Then exhale slowly, allowing your stomach muscles to relax.  Self-talk. This is a skill where you identify thought patterns that lead to anxiety reactions and correct those thoughts.  Muscle relaxation. This involves tensing muscles then relaxing them.  Choose a stress reduction technique that fits your lifestyle and personality. Stress reduction techniques take time and practice. Set aside 5-15 minutes a day to do them. Therapists can offer training in these techniques. The training may be covered by some insurance plans. Other things you can do to manage stress include:  Keeping a  stress diary. This can help you learn what triggers your stress and ways to control your response.  Thinking about how you respond to certain situations. You may not be able to control everything, but you can control your reaction.  Making time for activities that help you relax, and not feeling guilty about spending your time in this way.  Therapy combined with coping and stress-reduction skills provides the best chance for successful treatment. Medicines Medicines can help ease symptoms. Medicines for anxiety include:  Anti-anxiety drugs.  Antidepressants.  Beta-blockers.  Medicines may be used as the main treatment for anxiety disorder, along with therapy, or if other treatments are not working. Medicines should be prescribed by a health care provider. Relationships Relationships can play a big part in helping you recover. Try to spend more time connecting with trusted friends and family members. Consider going to couples counseling, taking family education classes, or going to family therapy. Therapy can help you and others better understand the condition. How to recognize changes in your condition Everyone has a different response to treatment for anxiety. Recovery from anxiety happens when symptoms decrease and stop interfering with your daily activities at home or work. This may mean that you will start to:  Have better concentration and focus.  Sleep better.  Be less irritable.  Have more energy.  Have improved memory.  It is important to recognize when your condition is getting worse. Contact your health care provider if your symptoms interfere with home or work and you do not feel like your condition   is improving. Where to find help and support: You can get help and support from these sources:  Self-help groups.  Online and [employer].  A trusted spiritual leader.  Couples counseling.  Family education classes.  Family therapy.  Follow these  instructions at home:  Eat a healthy diet that includes plenty of vegetables, fruits, whole grains, low-fat dairy products, and lean protein. Do not eat a lot of foods that are high in solid fats, added sugars, or salt.  Exercise. Most adults should do the following: ? Exercise for at least 150 minutes each week. The exercise should increase your heart rate and make you sweat (moderate-intensity exercise). ? Strengthening exercises at least twice a week.  Cut down on caffeine, tobacco, alcohol, and other potentially harmful substances.  Get the right amount and quality of sleep. Most adults need 7-9 hours of sleep each night.  Make choices that simplify your life.  Take over-the-counter and prescription medicines only as told by your health care provider.  Avoid caffeine, alcohol, and certain over-the-counter cold medicines. These may make you feel worse. Ask your pharmacist which medicines to avoid.  Keep all follow-up visits as told by your health care provider. This is important. Questions to ask your health care provider  Would I benefit from therapy?  How often should I follow up with a health care provider?  How long do I need to take medicine?  Are there any long-term side effects of my medicine?  Are there any alternatives to taking medicine? Contact a health care provider if:  You have a hard time staying focused or finishing daily tasks.  You spend many hours a day feeling worried about everyday life.  You become exhausted by worry.  You start to have headaches, feel tense, or have nausea.  You urinate more than normal.  You have diarrhea. Get help right away if:  You have a racing heart and shortness of breath.  You have thoughts of hurting yourself or others. If you ever feel like you may hurt yourself or others, or have thoughts about taking your own life, get help right away. You can go to your nearest emergency department or call:  Your local emergency  services (911 in the [place]  A suicide crisis helpline, such as the National Suicide Prevention Lifeline at 720-316-1229. This is open 24-hours a day.  Summary  Taking steps to deal with stress can help calm you.  Medicines cannot cure anxiety disorders, but they can help ease symptoms.  Family, friends, and partners can play a big part in helping you recover from an anxiety disorder. This information is not intended to replace advice given to you by your health care provider. Make sure you discuss any questions you have with your health care provider. Document Released: 06/11/2016 Document Revised: 06/11/2016 Document Reviewed: 06/11/2016 Elsevier Interactive Patient Education  2018 [employer].   Citalopram tablets What is this medicine? CITALOPRAM (sye TAL oh pram) is a medicine for depression. This medicine may be used for other purposes; ask your health care provider or pharmacist if you have questions. COMMON BRAND NAME(S): Celexa What should I tell my health care provider before I take this medicine? They need to know if you have any of these conditions: -bleeding disorders -bipolar disorder or a family history of bipolar disorder -glaucoma -heart disease -history of irregular heartbeat -kidney disease -liver disease -low levels of magnesium or potassium in the blood -receiving electroconvulsive therapy -seizures -suicidal thoughts, plans, or attempt; a  previous suicide attempt by you or a family member -take medicines that treat or prevent blood clots -thyroid disease -an unusual or allergic reaction to citalopram, escitalopram, other medicines, foods, dyes, or preservatives -pregnant or trying to become pregnant -breast-feeding How should I use this medicine? Take this medicine by mouth with a glass of water. Follow the directions on the prescription label. You can take it with or without food. Take your medicine at regular intervals. Do not take your medicine  more often than directed. Do not stop taking this medicine suddenly except upon the advice of your doctor. Stopping this medicine too quickly may cause serious side effects or your condition may worsen. A special MedGuide will be given to you by the pharmacist with each prescription and refill. Be sure to read this information carefully each time. Talk to your pediatrician regarding the use of this medicine in children. Special care may be needed. Patients over 24 years old may have a stronger reaction and need a smaller dose. Overdosage: If you think you have taken too much of this medicine contact a poison control center or emergency room at once. NOTE: This medicine is only for you. Do not share this medicine with others. What if I miss a dose? If you miss a dose, take it as soon as you can. If it is almost time for your next dose, take only that dose. Do not take double or extra doses. What may interact with this medicine? Do not take this medicine with any of the following medications: -certain medicines for fungal infections like fluconazole, itraconazole, ketoconazole, posaconazole, voriconazole -cisapride -dofetilide -dronedarone -escitalopram -linezolid -MAOIs like Carbex, Eldepryl, Marplan, Nardil, and Parnate -methylene blue (injected into a vein) -pimozide -thioridazine -ziprasidone This medicine may also interact with the following medications: -alcohol -amphetamines -aspirin and aspirin-like medicines -carbamazepine -certain medicines for depression, anxiety, or psychotic disturbances -certain medicines for infections like chloroquine, clarithromycin, erythromycin, furazolidone, isoniazid, pentamidine -certain medicines for migraine headaches like almotriptan, eletriptan, frovatriptan, naratriptan, rizatriptan, sumatriptan, zolmitriptan -certain medicines for sleep -certain medicines that treat or prevent blood clots like dalteparin, enoxaparin,  warfarin -cimetidine -diuretics -fentanyl -lithium -methadone -metoprolol -NSAIDs, medicines for pain and inflammation, like ibuprofen or naproxen -omeprazole -other medicines that prolong the QT interval (cause an abnormal heart rhythm) -procarbazine -rasagiline -supplements like St. John's wort, kava kava, valerian -tramadol -tryptophan This list may not describe all possible interactions. Give your health care provider a list of all the medicines, herbs, non-prescription drugs, or dietary supplements you use. Also tell them if you smoke, drink alcohol, or use illegal drugs. Some items may interact with your medicine. What should I watch for while using this medicine? Tell your doctor if your symptoms do not get better or if they get worse. Visit your doctor or health care professional for regular checks on your progress. Because it may take several weeks to see the full effects of this medicine, it is important to continue your treatment as prescribed by your doctor. Patients and their families should watch out for new or worsening thoughts of suicide or depression. Also watch out for sudden changes in feelings such as feeling anxious, agitated, panicky, irritable, hostile, aggressive, impulsive, severely restless, overly excited and hyperactive, or not being able to sleep. If this happens, especially at the beginning of treatment or after a change in dose, call your health care professional. Bonita Quin may get drowsy or dizzy. Do not drive, use machinery, or do anything that needs mental alertness until you know how  this medicine affects you. Do not stand or sit up quickly, especially if you are an older patient. This reduces the risk of dizzy or fainting spells. Alcohol may interfere with the effect of this medicine. Avoid alcoholic drinks. Your mouth may get dry. Chewing sugarless gum or sucking hard candy, and drinking plenty of water will help. Contact your doctor if the problem does not go away  or is severe. What side effects may I notice from receiving this medicine? Side effects that you should report to your doctor or health care professional as soon as possible: -allergic reactions like skin rash, itching or hives, swelling of the face, lips, or tongue -anxious -black, tarry stools -breathing problems -changes in vision -chest pain -confusion -elevated mood, decreased need for sleep, racing thoughts, impulsive behavior -eye pain -fast, irregular heartbeat -feeling faint or lightheaded, falls -feeling agitated, angry, or irritable -hallucination, loss of contact with reality -loss of balance or coordination -loss of memory -painful or prolonged erections -restlessness, pacing, inability to keep still -seizures -stiff muscles -suicidal thoughts or other mood changes -trouble sleeping -unusual bleeding or bruising -unusually weak or tired -vomiting Side effects that usually do not require medical attention (report to your doctor or health care professional if they continue or are bothersome): -change in appetite or weight -change in sex drive or performance -dizziness -headache -increased sweating -indigestion, nausea -tremors This list may not describe all possible side effects. Call your doctor for medical advice about side effects. You may report side effects to FDA at 1-800-FDA-1088. Where should I keep my medicine? Keep out of reach of children. Store at room temperature between 15 and 30 degrees C (59 and 86 degrees F). Throw away any unused medicine after the expiration date. NOTE: This sheet is a summary. It may not cover all possible information. If you have questions about this medicine, talk to your doctor, pharmacist, or health care provider.  2018 Elsevier/Gold Standard (2015-11-20 13:18:52)

## 2017-08-08 NOTE — Progress Notes (Signed)
   Subjective:  Patient ID: Timothy Stark, male    DOB: 10/03/1985  Age: 32 y.o. MRN: [id number]  CC: Follow-up   HPI Timothy Stark presents for anxiety   Anxiety symptoms include: chest pain, feelings of losing control, insomnia, paresthesias, racing thoughts, poor appetite, and hot flashes. He states " I guess I was having a panic attack". He several previous visit to the urgent care/ED for similar symptoms. He denies any CP since last ED visit. He denies any changes to bowel or bladder habits. Onset of symptoms was approximately several months ago, gradually worsening since that time. He denies current suicidal and homicidal ideation. Family history significant for anxiety.Possible organic causes contributing are: none. Risk factors: family history of anxiety disorder. Stressors: none. He works as a Naval architecttruck driver. Previous treatment includes Atarax prn .  He complains of the following side effects from the treatment: drowsiness. He reports not aking medication due to symptoms and discontinued on his own. He is agreeable to speaking with the LCSW at this time and starting another  medication for anxiety.    Outpatient Medications Prior to Visit  Medication Sig Dispense Refill  . hydrOXYzine (ATARAX/VISTARIL) 25 MG tablet Take 1 tablet (25 mg total) by mouth every 8 (eight) hours as needed for anxiety. (Patient not taking: Reported on 06/13/2017) 12 tablet 0   No facility-administered medications prior to visit.     ROS Review of Systems  Constitutional: Negative.   Respiratory: Negative.   Cardiovascular: Negative.   Psychiatric/Behavioral: The patient is nervous/anxious.         Objective:  BP (!) 142/88 (BP Location: Left Arm, Patient Position: Sitting, Cuff Size: Normal)   Pulse 90   Temp 98.1 F (36.7 C) (Oral)   Resp 16   Ht 5\' 8"  (1.727 m)   Wt 151 lb 3.2 oz (68.6 kg)   SpO2 100%   BMI 22.99 kg/m   BP/Weight 08/08/2017 08/02/2017 08/01/2017  Systolic BP  142 [id number] -  Diastolic BP 88 87 -  Wt. (Lbs) 151.2 - 150.57  BMI 22.99 - 23.58     Physical Exam  Constitutional: He appears well-developed and well-nourished.  Cardiovascular: Normal rate, regular rhythm, normal heart sounds and intact distal pulses.  Pulmonary/Chest: Effort normal and breath sounds normal.  Psychiatric: His mood appears anxious. He expresses no homicidal and no suicidal ideation. He expresses no suicidal plans and no homicidal plans. He is communicative. He is attentive.  Nursing note and vitals reviewed.    Assessment & Plan:   1. Anxiety disorder, unspecified type  - citalopram (CELEXA) 20 MG tablet; Take 1 tablet (20 mg total) by mouth daily.  Dispense: 30 tablet; Refill: 2  2. History of panic attacks  - citalopram (CELEXA) 20 MG tablet; Take 1 tablet (20 mg total) by mouth daily.  Dispense: 30 tablet; Refill: 2      Follow-up: Return in about 6 weeks (around 09/19/2017) for Anxiety .   Lizbeth BarkMandesia R Teylor Wolven FNP

## 2017-08-08 NOTE — Progress Notes (Signed)
Patient is here for a follow-up for anxiety.   Patient stated he cannot take Hydroxyzine due to it makes him sleepy and tired.

## 2017-08-08 NOTE — BH Specialist Note (Signed)
Integrated Behavioral Health Initial Visit  MRN: [id number] Name: Timothy Stark  Number of Integrated Behavioral Health Clinician visits:: 1/6 Session Start time: 9:20 PM  Session End time: 9:50 PM Total time: 30 minutes  Type of Service: Integrated Behavioral Health- Individual/Family Interpretor:No. Interpretor Name and Language: N/A   Warm Hand Off Completed.       SUBJECTIVE: Timothy Stark is a [age] y.o. male accompanied by self Patient was referred by FNP Hairston for anxiety. Patient reports the following symptoms/concerns: racing thoughts, overwhelming worry about health, difficulty sleeping, and panic attacks Duration of problem: Onset occurred approx three years ago; Severity of problem: moderate  OBJECTIVE: Mood: Anxious and Pleasant and Affect: Appropriate Risk of harm to self or others: No plan to harm self or others  LIFE CONTEXT: Family and Social: Pt receives strong support from wife and family.  School/Work: Pt is employed Self-Care: Pt reports that he quit smoking cigarettes approximately five years ago. He currently vapes daily Life Changes: Pt has been experiencing increased stress about his health.   GOALS ADDRESSED: Patient will: 1. Reduce symptoms of: anxiety 2. Increase knowledge and/or ability of: coping skills  3. Demonstrate ability to: Increase adequate support systems for patient/family  INTERVENTIONS: Interventions utilized: Mindfulness or Management consultantelaxation Training, Psychoeducation and/or Health Education and Link to [employer]  Standardized Assessments completed: GAD-7 and PHQ 2&9  ASSESSMENT: Patient currently experiencing anxiety. He reports racing thoughts, overwhelming worry about health, difficulty sleeping, and panic attacks. Pt receives support from family.    Patient may benefit from psychoeducation, psychotherapy, and medication management. LCSWA educated pt on the correlation between one's physical and mental  health. LCSWA introduced therapeutic interventions that promote mindfulness/relaxation. Pt provided well-being smartphone apps to utilize. He is not currently interested in participating in therapy or medication management. Behavioral health resources provided.  PLAN: 1. Follow up with behavioral health clinician on : Pt was encouraged to contact LCSWA if symptoms worsen or fail to improve to [facility] 2. Behavioral recommendations: LCSWA recommends that pt apply healthy coping skills discussed and utilize provided resources. Pt is encouraged to schedule follow up appointment with LCSWA 3. Referral(s[facility] 4. "From scale of 1-10, how likely are you to follow plan?": 9/10  Bridgett LarssonJasmine D Earlie Schank, LCSW 08/08/17 10:29 AM

## 2017-09-19 ENCOUNTER — Encounter: Payer: Self-pay | Admitting: Nurse Practitioner

## 2017-09-19 ENCOUNTER — Ambulatory Visit: Payer: Self-pay | Attending: Nurse Practitioner | Admitting: Nurse Practitioner

## 2017-09-19 VITALS — BP 134/84 | HR 107 | Temp 98.2°F | Ht 68.0 in | Wt 151.8 lb

## 2017-09-19 DIAGNOSIS — Z79899 Other long term (current) drug therapy: Secondary | ICD-10-CM | POA: Insufficient documentation

## 2017-09-19 DIAGNOSIS — F419 Anxiety disorder, unspecified: Secondary | ICD-10-CM | POA: Insufficient documentation

## 2017-09-19 NOTE — Patient Instructions (Addendum)
Generalized Anxiety Disorder, Adult Generalized anxiety disorder (GAD) is a mental health disorder. People with this condition constantly worry about everyday events. Unlike normal anxiety, worry related to GAD is not triggered by a specific event. These worries also do not fade or get better with time. GAD interferes with life functions, including relationships, work, and school. GAD can vary from mild to severe. People with severe GAD can have intense waves of anxiety with physical symptoms (panic attacks). What are the causes? The exact cause of GAD is not known. What increases the risk? This condition is more likely to develop in:  Women.  People who have a family history of anxiety disorders.  People who are very shy.  People who experience very stressful life events, such as the death of a loved one.  People who have a very stressful family environment.  What are the signs or symptoms? People with GAD often worry excessively about many things in their lives, such as their health and family. They may also be overly concerned about:  Doing well at work.  Being on time.  Natural disasters.  Friendships.  Physical symptoms of GAD include:  Fatigue.  Muscle tension or having muscle twitches.  Trembling or feeling shaky.  Being easily startled.  Feeling like your heart is pounding or racing.  Feeling out of breath or like you cannot take a deep breath.  Having trouble falling asleep or staying asleep.  Sweating.  Nausea, diarrhea, or irritable bowel syndrome (IBS).  Headaches.  Trouble concentrating or remembering facts.  Restlessness.  Irritability.  How is this diagnosed? Your health care provider can diagnose GAD based on your symptoms and medical history. You will also have a physical exam. The health care provider will ask specific questions about your symptoms, including how severe they are, when they started, and if they come and go. Your health care  provider may ask you about your use of alcohol or drugs, including prescription medicines. Your health care provider may refer you to a mental health specialist for further evaluation. Your health care provider will do a thorough examination and may perform additional tests to rule out other possible causes of your symptoms. To be diagnosed with GAD, a person must have anxiety that:  Is out of his or her control.  Affects several different aspects of his or her life, such as work and relationships.  Causes distress that makes him or her unable to take part in normal activities.  Includes at least three physical symptoms of GAD, such as restlessness, fatigue, trouble concentrating, irritability, muscle tension, or sleep problems.  Before your health care provider can confirm a diagnosis of GAD, these symptoms must be present more days than they are not, and they must last for six months or longer. How is this treated? The following therapies are usually used to treat GAD:  Medicine. Antidepressant medicine is usually prescribed for long-term daily control. Antianxiety medicines may be added in severe cases, especially when panic attacks occur.  Talk therapy (psychotherapy). Certain types of talk therapy can be helpful in treating GAD by providing support, education, and guidance. Options include: ? Cognitive behavioral therapy (CBT). People learn coping skills and techniques to ease their anxiety. They learn to identify unrealistic or negative thoughts and behaviors and to replace them with positive ones. ? Acceptance and commitment therapy (ACT). This treatment teaches people how to be mindful as a way to cope with unwanted thoughts and feelings. ? Biofeedback. This process trains you to   manage your body's response (physiological response) through breathing techniques and relaxation methods. You will work with a therapist while machines are used to monitor your physical symptoms.  Stress  management techniques. These include yoga, meditation, and exercise.  A mental health specialist can help determine which treatment is best for you. Some people see improvement with one type of therapy. However, other people require a combination of therapies. Follow these instructions at home:  Take over-the-counter and prescription medicines only as told by your health care provider.  Try to maintain a normal routine.  Try to anticipate stressful situations and allow extra time to manage them.  Practice any stress management or self-calming techniques as taught by your health care provider.  Do not punish yourself for setbacks or for not making progress.  Try to recognize your accomplishments, even if they are small.  Keep all follow-up visits as told by your health care provider. This is important. Contact a health care provider if:  Your symptoms do not get better.  Your symptoms get worse.  You have signs of depression, such as: ? A persistently sad, cranky, or irritable mood. ? Loss of enjoyment in activities that used to bring you joy. ? Change in weight or eating. ? Changes in sleeping habits. ? Avoiding friends or family members. ? Loss of energy for normal tasks. ? Feelings of guilt or worthlessness. Get help right away if:  You have serious thoughts about hurting yourself or others. If you ever feel like you may hurt yourself or others, or have thoughts about taking your own life, get help right away. You can go to your nearest emergency department or call:  Your local emergency services (911 in the [place]  A suicide crisis helpline, such as the National Suicide Prevention Lifeline at 859-794-6844. This is open 24 hours a day.  Summary  Generalized anxiety disorder (GAD) is a mental health disorder that involves worry that is not triggered by a specific event.  People with GAD often worry excessively about many things in their lives, such as their health and  family.  GAD may cause physical symptoms such as restlessness, trouble concentrating, sleep problems, frequent sweating, nausea, diarrhea, headaches, and trembling or muscle twitching.  A mental health specialist can help determine which treatment is best for you. Some people see improvement with one type of therapy. However, other people require a combination of therapies. This information is not intended to replace advice given to you by your health care provider. Make sure you discuss any questions you have with your health care provider. Document Released: 10/12/2012 Document Revised: 05/07/2016 Document Reviewed: 05/07/2016 Elsevier Interactive Patient Education  2018 [employer] Inc[employer]  Generalized Anxiety Disorder, Adult Generalized anxiety disorder (GAD) is a mental health disorder. People with this condition constantly worry about everyday events. Unlike normal anxiety, worry related to GAD is not triggered by a specific event. These worries also do not fade or get better with time. GAD interferes with life functions, including relationships, work, and school. GAD can vary from mild to severe. People with severe GAD can have intense waves of anxiety with physical symptoms (panic attacks). What are the causes? The exact cause of GAD is not known. What increases the risk? This condition is more likely to develop in:  Women.  People who have a family history of anxiety disorders.  People who are very shy.  People who experience very stressful life events, such as the death of a loved one.  People who have  a very stressful family environment.  What are the signs or symptoms? People with GAD often worry excessively about many things in their lives, such as their health and family. They may also be overly concerned about:  Doing well at work.  Being on time.  Natural disasters.  Friendships.  Physical symptoms of GAD include:  Fatigue.  Muscle tension or having muscle  twitches.  Trembling or feeling shaky.  Being easily startled.  Feeling like your heart is pounding or racing.  Feeling out of breath or like you cannot take a deep breath.  Having trouble falling asleep or staying asleep.  Sweating.  Nausea, diarrhea, or irritable bowel syndrome (IBS).  Headaches.  Trouble concentrating or remembering facts.  Restlessness.  Irritability.  How is this diagnosed? Your health care provider can diagnose GAD based on your symptoms and medical history. You will also have a physical exam. The health care provider will ask specific questions about your symptoms, including how severe they are, when they started, and if they come and go. Your health care provider may ask you about your use of alcohol or drugs, including prescription medicines. Your health care provider may refer you to a mental health specialist for further evaluation. Your health care provider will do a thorough examination and may perform additional tests to rule out other possible causes of your symptoms. To be diagnosed with GAD, a person must have anxiety that:  Is out of his or her control.  Affects several different aspects of his or her life, such as work and relationships.  Causes distress that makes him or her unable to take part in normal activities.  Includes at least three physical symptoms of GAD, such as restlessness, fatigue, trouble concentrating, irritability, muscle tension, or sleep problems.  Before your health care provider can confirm a diagnosis of GAD, these symptoms must be present more days than they are not, and they must last for six months or longer. How is this treated? The following therapies are usually used to treat GAD:  Medicine. Antidepressant medicine is usually prescribed for long-term daily control. Antianxiety medicines may be added in severe cases, especially when panic attacks occur.  Talk therapy (psychotherapy). Certain types of talk  therapy can be helpful in treating GAD by providing support, education, and guidance. Options include: ? Cognitive behavioral therapy (CBT). People learn coping skills and techniques to ease their anxiety. They learn to identify unrealistic or negative thoughts and behaviors and to replace them with positive ones. ? Acceptance and commitment therapy (ACT). This treatment teaches people how to be mindful as a way to cope with unwanted thoughts and feelings. ? Biofeedback. This process trains you to manage your body's response (physiological response) through breathing techniques and relaxation methods. You will work with a therapist while machines are used to monitor your physical symptoms.  Stress management techniques. These include yoga, meditation, and exercise.  A mental health specialist can help determine which treatment is best for you. Some people see improvement with one type of therapy. However, other people require a combination of therapies. Follow these instructions at home:  Take over-the-counter and prescription medicines only as told by your health care provider.  Try to maintain a normal routine.  Try to anticipate stressful situations and allow extra time to manage them.  Practice any stress management or self-calming techniques as taught by your health care provider.  Do not punish yourself for setbacks or for not making progress.  Try to recognize your accomplishments,  even if they are small.  Keep all follow-up visits as told by your health care provider. This is important. Contact a health care provider if:  Your symptoms do not get better.  Your symptoms get worse.  You have signs of depression, such as: ? A persistently sad, cranky, or irritable mood. ? Loss of enjoyment in activities that used to bring you joy. ? Change in weight or eating. ? Changes in sleeping habits. ? Avoiding friends or family members. ? Loss of energy for normal tasks. ? Feelings of  guilt or worthlessness. Get help right away if:  You have serious thoughts about hurting yourself or others. If you ever feel like you may hurt yourself or others, or have thoughts about taking your own life, get help right away. You can go to your nearest emergency department or call:  Your local emergency services (911 in the [place]  A suicide crisis helpline, such as the National Suicide Prevention Lifeline at (541) 737-2729. This is open 24 hours a day.  Summary  Generalized anxiety disorder (GAD) is a mental health disorder that involves worry that is not triggered by a specific event.  People with GAD often worry excessively about many things in their lives, such as their health and family.  GAD may cause physical symptoms such as restlessness, trouble concentrating, sleep problems, frequent sweating, nausea, diarrhea, headaches, and trembling or muscle twitching.  A mental health specialist can help determine which treatment is best for you. Some people see improvement with one type of therapy. However, other people require a combination of therapies. This information is not intended to replace advice given to you by your health care provider. Make sure you discuss any questions you have with your health care provider. Document Released: 10/12/2012 Document Revised: 05/07/2016 Document Reviewed: 05/07/2016 Elsevier Interactive Patient Education  2018 [employer].  Living With Anxiety After being diagnosed with an anxiety disorder, you may be relieved to know why you have felt or behaved a certain way. It is natural to also feel overwhelmed about the treatment ahead and what it will mean for your life. With care and support, you can manage this condition and recover from it. How to cope with anxiety Dealing with stress Stress is your body's reaction to life changes and events, both good and bad. Stress can last just a few hours or it can be ongoing. Stress can play a major role in  anxiety, so it is important to learn both how to cope with stress and how to think about it differently. Talk with your health care provider or a counselor to learn more about stress reduction. He or she may suggest some stress reduction techniques, such as:  Music therapy. This can include creating or listening to music that you enjoy and that inspires you.  Mindfulness-based meditation. This involves being aware of your normal breaths, rather than trying to control your breathing. It can be done while sitting or walking.  Centering prayer. This is a kind of meditation that involves focusing on a word, phrase, or sacred image that is meaningful to you and that brings you peace.  Deep breathing. To do this, expand your stomach and inhale slowly through your nose. Hold your breath for 3-5 seconds. Then exhale slowly, allowing your stomach muscles to relax.  Self-talk. This is a skill where you identify thought patterns that lead to anxiety reactions and correct those thoughts.  Muscle relaxation. This involves tensing muscles then relaxing them.  Choose a stress  reduction technique that fits your lifestyle and personality. Stress reduction techniques take time and practice. Set aside 5-15 minutes a day to do them. Therapists can offer training in these techniques. The training may be covered by some insurance plans. Other things you can do to manage stress include:  Keeping a stress diary. This can help you learn what triggers your stress and ways to control your response.  Thinking about how you respond to certain situations. You may not be able to control everything, but you can control your reaction.  Making time for activities that help you relax, and not feeling guilty about spending your time in this way.  Therapy combined with coping and stress-reduction skills provides the best chance for successful treatment. Medicines Medicines can help ease symptoms. Medicines for anxiety  include:  Anti-anxiety drugs.  Antidepressants.  Beta-blockers.  Medicines may be used as the main treatment for anxiety disorder, along with therapy, or if other treatments are not working. Medicines should be prescribed by a health care provider. Relationships Relationships can play a big part in helping you recover. Try to spend more time connecting with trusted friends and family members. Consider going to couples counseling, taking family education classes, or going to family therapy. Therapy can help you and others better understand the condition. How to recognize changes in your condition Everyone has a different response to treatment for anxiety. Recovery from anxiety happens when symptoms decrease and stop interfering with your daily activities at home or work. This may mean that you will start to:  Have better concentration and focus.  Sleep better.  Be less irritable.  Have more energy.  Have improved memory.  It is important to recognize when your condition is getting worse. Contact your health care provider if your symptoms interfere with home or work and you do not feel like your condition is improving. Where to find help and support: You can get help and support from these sources:  Self-help groups.  Online and [employer].  A trusted spiritual leader.  Couples counseling.  Family education classes.  Family therapy.  Follow these instructions at home:  Eat a healthy diet that includes plenty of vegetables, fruits, whole grains, low-fat dairy products, and lean protein. Do not eat a lot of foods that are high in solid fats, added sugars, or salt.  Exercise. Most adults should do the following: ? Exercise for at least 150 minutes each week. The exercise should increase your heart rate and make you sweat (moderate-intensity exercise). ? Strengthening exercises at least twice a week.  Cut down on caffeine, tobacco, alcohol, and other potentially  harmful substances.  Get the right amount and quality of sleep. Most adults need 7-9 hours of sleep each night.  Make choices that simplify your life.  Take over-the-counter and prescription medicines only as told by your health care provider.  Avoid caffeine, alcohol, and certain over-the-counter cold medicines. These may make you feel worse. Ask your pharmacist which medicines to avoid.  Keep all follow-up visits as told by your health care provider. This is important. Questions to ask your health care provider  Would I benefit from therapy?  How often should I follow up with a health care provider?  How long do I need to take medicine?  Are there any long-term side effects of my medicine?  Are there any alternatives to taking medicine? Contact a health care provider if:  You have a hard time staying focused or finishing daily tasks.  You  spend many hours a day feeling worried about everyday life.  You become exhausted by worry.  You start to have headaches, feel tense, or have nausea.  You urinate more than normal.  You have diarrhea. Get help right away if:  You have a racing heart and shortness of breath.  You have thoughts of hurting yourself or others. If you ever feel like you may hurt yourself or others, or have thoughts about taking your own life, get help right away. You can go to your nearest emergency department or call:  Your local emergency services (911 in the [place]  A suicide crisis helpline, such as the National Suicide Prevention Lifeline at 402-438-40211-310-871-2260. This is open 24-hours a day.  Summary  Taking steps to deal with stress can help calm you.  Medicines cannot cure anxiety disorders, but they can help ease symptoms.  Family, friends, and partners can play a big part in helping you recover from an anxiety disorder. This information is not intended to replace advice given to you by your health care provider. Make sure you discuss any  questions you have with your health care provider. Document Released: 06/11/2016 Document Revised: 06/11/2016 Document Reviewed: 06/11/2016 Elsevier Interactive Patient Education  2018 [employer]  Panic Attacks Panic attacks are sudden, short feelings of great fear or discomfort. You may have them for no reason when you are relaxed, when you are uneasy (anxious), or when you are sleeping. Follow these instructions at home:  Take all your medicines as told.  Check with your doctor before starting new medicines.  Keep all doctor visits. Contact a doctor if:  You are not able to take your medicines as told.  Your symptoms do not get better.  Your symptoms get worse. Get help right away if:  Your attacks seem different than your normal attacks.  You have thoughts about hurting yourself or others.  You take panic attack medicine and you have a side effect. This information is not intended to replace advice given to you by your health care provider. Make sure you discuss any questions you have with your health care provider. Document Released: 07/20/2010 Document Revised: 11/23/2015 Document Reviewed: 01/29/2013 Elsevier Interactive Patient Education  2017 [employer]

## 2017-09-19 NOTE — Progress Notes (Signed)
Assessment & Plan:  Timothy Timothy Stark was seen today for establish care.  Diagnoses and all orders for this visit:  Anxiety Behavioral Health Resources:   What if I or someone I know is in crisis?  . If you are thinking about harming yourself or having thoughts of suicide, or if you know someone who is, seek help right away.  . Call your doctor or mental health care provider.  . Call 911 or go to a hospital emergency room to get immediate help, or ask a friend or family member to help you do these things.  . Call the [place] National Suicide Prevention Lifeline's toll-free, 24-hour hotline at 1-800-273-TALK (970)110-5170) or TTY: 1-800-799-4 TTY (708) 853-3471) to talk to a trained counselor.  . If you are in crisis, make sure you are not left alone.   . If someone else is in crisis, make sure he or she is not left alone   24 Hour Availability  [facility]  [address] [place], [place] [address]  [phone] or [phone]  [facility] [employer] (Domestic Violence, Rape & Victim Assistance ([phone]  [facility]  [address], [place]  [address]               [phone] or [phone]  [facility] [place] [facility]    (ONLY from 8am-4pm)    [phone]  [facility] (24/7)   [phone]  [place] National Suicide Hotline   (575) 229-2395 Len Childs)  Support from local police to aid getting patient to hospital (http://www.[place].gov/index.aspx?page=2797)         ONGOING BEHAVIORAL HEALTH SUPPORT FOR UNINSURED and UNDERINSURED:  Vesta Mixer  5817174900   [address]  Walk-in first time, Monday-Friday, 8:30am-5:00pm  *Bring snack, drink, something to do, long wait at first visit, they do have pharmacy for behavioral health medications/ Bring own interpreter at first visit, if needed  [facility] [facility] [employer]  [phone]  [address]  Walk-in Monday-Friday, 8:30am-12pm & 1-2:30pm  *pacientes que hablen espanol, favor comunicarse con el Sr. [place], extension 2244 o la [address], extension 3331 para hacer [facility]:  (587)[phone] or [email or web address]  [address]  Call or email, may self-refer  * uninsured/underinsured, [phone], have both mental health and substance use challenges   [facility]:  Phone [phone]; Fax [phone]  *Call to schedule an appointment  3rd Floor located @?[address], corner of [address]  Mon-Thursday: 8:30am-8:00pm Friday: 8:30am-7:00pm  * Be sure to park in a space labeled "Psychology Department," located to the right of the main door of the building. Enter the main doors facing the parking lot and take the elevator or stairs to the 3rd Floor.   [facility]:  ([phone] or  [phone] (24/hour helpline)  [address]  Call to make appointment, tends to be a long wait to begin services, depending on insurance    [facility]  [phone] ??  *Call to schedule an appointment   [address]  Monday-Friday, 8:00am-5:00pm            [facility]  [phone] [address], [place]  Monday-Friday, walk-in 8am-3pm  First appointment is assessment, then will make appointment for psychiatry      Patient has been counseled on age-appropriate  routine health concerns for screening and prevention. These are reviewed and up-to-date. Referrals have been placed accordingly. Immunizations are up-to-date or declined.    Subjective:   Chief Complaint  Patient presents with  . Establish Care    Pt is here to establish care for anxiety. Pt's stated he think his anxiety may cause his blood pressure high.    HPI Timothy Timothy Stark Timothy Stark 32 y.o. male presents to office today to establish care. He has a history of anxiety. He has been  prescribed Celexa however he states he has never taken it. He prefers to treat his anxiety without medication at this time. Onset of symptoms started one year ago when he thought he had testicular cancer (which was ruled out). He states he has Vaped for 4 years and recently (1 day ago) stopped and states he feels this may have also contributed to his anxiety due to the chemicals he was inhaling. He reports recently changing jobs which has also decreased his stress and anxiety.   Review of Systems  Constitutional: Negative for fever, malaise/fatigue and weight loss.  HENT: Negative.  Negative for nosebleeds.   Eyes: Negative.  Negative for blurred vision, double vision and photophobia.  Respiratory: Negative.  Negative for cough and shortness of breath.   Cardiovascular: Negative.  Negative for chest pain, palpitations and leg swelling.  Gastrointestinal: Negative.  Negative for heartburn, nausea and vomiting.  Musculoskeletal: Negative.  Negative for myalgias.  Neurological: Negative.  Negative for dizziness, focal weakness, seizures and headaches.  Psychiatric/Behavioral: Negative for suicidal ideas. The patient is nervous/anxious.     Past Medical History:  Diagnosis Date  . Anxiety     History reviewed. No pertinent surgical history.  Family History  Problem Relation Age of Onset  . Diabetes Mother   . Diabetes Father     Social History Reviewed with no changes to be made today.   Outpatient Medications Prior to Visit  Medication Sig Dispense Refill  . citalopram (CELEXA) 20 MG tablet Take 1 tablet (20 mg total) by mouth daily. (Patient not taking: Reported on 09/19/2017) 30 tablet 2   No facility-administered medications prior to visit.     No Known Allergies     Objective:    BP 134/84 (BP Location: Right Arm, Patient Position: Sitting, Cuff Size: Normal)   Pulse (!) 107   Temp 98.2 F (36.8 C) (Oral)   Ht 5\' 8"  (1.727 m)   Wt 151 lb 12.8 oz (68.9 kg)   SpO2 97%    BMI 23.08 kg/m  Wt Readings from Last 3 Encounters:  09/19/17 151 lb 12.8 oz (68.9 kg)  08/08/17 151 lb 3.2 oz (68.6 kg)  08/01/17 150 lb 9.2 oz (68.3 kg)    Physical Exam  Constitutional: He is oriented to person, place, and time. He appears well-developed and well-nourished. He is cooperative.  HENT:  Head: Normocephalic and atraumatic.  Eyes: EOM are normal.  Neck: Normal range of motion.  Cardiovascular: Normal rate, regular rhythm, normal heart sounds and intact distal pulses. Exam reveals no gallop and no friction rub.  No murmur heard. Pulmonary/Chest: Effort normal and breath sounds normal. No tachypnea. No respiratory distress. He has no decreased breath sounds. He has no wheezes. He has no rhonchi. He has no rales. He exhibits no tenderness.  Abdominal: Soft. Bowel sounds are normal.  Musculoskeletal: Normal range of motion. He exhibits no edema.  Neurological: He is alert and oriented to person, place, and time. Coordination normal.  Skin: Skin  is warm and dry.  Psychiatric: He has a normal mood and affect. His speech is normal and behavior is normal. Judgment and thought content normal. Cognition and memory are normal. He expresses no homicidal and no suicidal ideation. He expresses no suicidal plans and no homicidal plans.  Nursing note and vitals reviewed.     Patient has been counseled extensively about nutrition and exercise as well as the importance of adherence with medications and regular follow-up. The patient was given clear instructions to go to ER or return to medical center if symptoms don't improve, worsen or new problems develop. The patient verbalized understanding.   Follow-up: Return for Physical ONLY no labs.   Claiborne Rigg, FNP-BC [facility] [place], [place] [phone]   09/19/2017, 10:15 PM

## 2017-10-31 ENCOUNTER — Ambulatory Visit: Payer: Self-pay | Attending: Nurse Practitioner | Admitting: Nurse Practitioner

## 2017-10-31 ENCOUNTER — Encounter: Payer: Self-pay | Admitting: Nurse Practitioner

## 2017-10-31 VITALS — BP 139/84 | HR 105 | Temp 98.3°F | Ht 68.0 in | Wt 149.2 lb

## 2017-10-31 DIAGNOSIS — F419 Anxiety disorder, unspecified: Secondary | ICD-10-CM | POA: Insufficient documentation

## 2017-10-31 DIAGNOSIS — Z0000 Encounter for general adult medical examination without abnormal findings: Secondary | ICD-10-CM | POA: Insufficient documentation

## 2017-10-31 MED ORDER — HYDROCHLOROTHIAZIDE 25 MG PO TABS: 25.0000 mg | ORAL_TABLET | Freq: Every day | ORAL | 3 refills | Status: DC

## 2017-10-31 MED ORDER — HYDROXYZINE HCL 25 MG PO TABS: 25.0000 mg | ORAL_TABLET | Freq: Every day | ORAL | 3 refills | Status: DC | PRN

## 2017-10-31 NOTE — Progress Notes (Signed)
Assessment & Plan:  Timothy Stark was seen today for annual exam.  Diagnoses and all orders for this visit:  Encounter for annual physical exam Return in 1 year or as needed  Anxiety -     hydrOXYzine (ATARAX/VISTARIL) 25 MG tablet; Take 1 tablet (25 mg total) by mouth daily as needed for anxiety.    Patient has been counseled on age-appropriate routine health concerns for screening and prevention. These are reviewed and up-to-date. Referrals have been placed accordingly. Immunizations are up-to-date or declined.    Subjective:   Chief Complaint  Patient presents with  . Annual Exam    Pt. is here for a physical.    HPI Timothy Stark 32 y.o. male presents to office today for annual physical exam.   Review of Systems  Constitutional: Negative.  Negative for chills, fever, malaise/fatigue and weight loss.  HENT: Negative.  Negative for congestion, hearing loss, sinus pain and sore throat.   Eyes: Negative.  Negative for blurred vision, double vision, photophobia and pain.  Respiratory: Negative.  Negative for cough, sputum production, shortness of breath and wheezing.   Cardiovascular: Negative.  Negative for chest pain and leg swelling.  Gastrointestinal: Negative.  Negative for abdominal pain, constipation, diarrhea, heartburn, nausea and vomiting.  Genitourinary: Negative.   Musculoskeletal: Negative.  Negative for joint pain and myalgias.  Skin: Negative.  Negative for rash.  Neurological: Negative.  Negative for dizziness, tremors, speech change, focal weakness, seizures and headaches.  Endo/Heme/Allergies: Negative.  Negative for environmental allergies.  Psychiatric/Behavioral: Negative for depression and suicidal ideas. The patient is nervous/anxious. The patient does not have insomnia.     Past Medical History:  Diagnosis Date  . Anxiety     History reviewed. No pertinent surgical history.  Family History  Problem Relation Age of Onset  . Diabetes Mother     . Diabetes Father     Social History Reviewed with no changes to be made today.   No outpatient medications prior to visit.   No facility-administered medications prior to visit.     No Known Allergies     Objective:    BP 139/84 (BP Location: Right Arm, Patient Position: Sitting, Cuff Size: Normal)   Pulse (!) 105   Temp 98.3 F (36.8 C) (Oral)   Ht  (1.727 m)   Wt 149 lb 3.2 oz (67.7 kg)   SpO2 97%   BMI 22.69 kg/m  Wt Readings from Last 3 Encounters:  10/31/17 149 lb 3.2 oz (67.7 kg)  09/19/17 151 lb 12.8 oz (68.9 kg)  08/08/17 151 lb 3.2 oz (68.6 kg)    Physical Exam  Constitutional: He is oriented to person, place, and time. He appears well-developed and well-nourished.  HENT:  Head: Normocephalic and atraumatic.  Right Ear: Hearing, tympanic membrane, external ear and ear canal normal.  Left Ear: Hearing, tympanic membrane, external ear and ear canal normal.  Nose: Nose normal. No mucosal edema or rhinorrhea.  Mouth/Throat: Uvula is midline, oropharynx is clear and moist and mucous membranes are normal. Tonsils are 1+ on the right. Tonsils are 1+ on the left. No tonsillar exudate.  Eyes: Pupils are equal, round, and reactive to light. Conjunctivae, EOM and lids are normal. No scleral icterus.  Neck: Normal range of motion. Neck supple. No tracheal deviation present. No thyromegaly present.  Cardiovascular: Normal rate, regular rhythm, normal heart sounds and intact distal pulses. Exam reveals no gallop and no friction rub.  No murmur heard. Pulmonary/Chest: Effort normal and  breath sounds normal. No respiratory distress. He has no wheezes. He has no rales. He exhibits no mass and no tenderness. Right breast exhibits no inverted nipple, no mass, no nipple discharge, no skin change and no tenderness. Left breast exhibits no inverted nipple, no mass, no nipple discharge, no skin change and no tenderness.  Abdominal: Soft. Bowel sounds are normal. He exhibits no  distension and no mass. There is no tenderness. There is no rebound and no guarding. Hernia confirmed negative in the right inguinal area and confirmed negative in the left inguinal area.  Genitourinary: Testes normal and penis normal. Right testis shows no mass, no swelling and no tenderness. Right testis is descended. Cremasteric reflex is not absent on the right side. Left testis shows no mass, no swelling and no tenderness. Left testis is descended. Cremasteric reflex is not absent on the left side.  Musculoskeletal: Normal range of motion. He exhibits no edema, tenderness or deformity.  Lymphadenopathy:    He has no cervical adenopathy. No inguinal adenopathy noted on the right or left side.       Right: No inguinal adenopathy present.       Left: No inguinal adenopathy present.  Neurological: He is alert and oriented to person, place, and time. He displays normal reflexes. No cranial nerve deficit. He exhibits normal muscle tone. Coordination normal.  Skin: Skin is warm and dry. Capillary refill takes less than 2 seconds. No erythema.  Psychiatric: He has a normal mood and affect. His behavior is normal. Judgment and thought content normal.       Patient has been counseled extensively about nutrition and exercise as well as the importance of adherence with medications and regular follow-up. The patient was given clear instructions to go to ER or return to medical center if symptoms don't improve, worsen or new problems develop. The patient verbalized understanding.   Follow-up: Return if symptoms worsen or fail to improve.   Claiborne Rigg, FNP-BC [facility] [place], [place] [phone]   10/31/2017, 1:55 PM

## 2017-10-31 NOTE — Patient Instructions (Signed)

## 2018-01-31 ENCOUNTER — Other Ambulatory Visit: Payer: Self-pay

## 2018-01-31 ENCOUNTER — Emergency Department (HOSPITAL_BASED_OUTPATIENT_CLINIC_OR_DEPARTMENT_OTHER)
Admission: EM | Admit: 2018-01-31 | Discharge: 2018-01-31 | Disposition: A | Discharge status: Home / Self Care | Payer: Self-pay | Attending: Emergency Medicine | Admitting: Emergency Medicine

## 2018-01-31 ENCOUNTER — Encounter (HOSPITAL_BASED_OUTPATIENT_CLINIC_OR_DEPARTMENT_OTHER): Payer: Self-pay

## 2018-01-31 DIAGNOSIS — M62838 Other muscle spasm: Secondary | ICD-10-CM | POA: Insufficient documentation

## 2018-01-31 DIAGNOSIS — M25512 Pain in left shoulder: Secondary | ICD-10-CM

## 2018-01-31 DIAGNOSIS — F17210 Nicotine dependence, cigarettes, uncomplicated: Secondary | ICD-10-CM | POA: Insufficient documentation

## 2018-01-31 MED ORDER — NAPROXEN 500 MG PO TABS: 500.0000 mg | ORAL_TABLET | Freq: Two times a day (BID) | ORAL | 0 refills | Status: DC

## 2018-01-31 MED ORDER — METHOCARBAMOL 500 MG PO TABS: 500.0000 mg | ORAL_TABLET | Freq: Two times a day (BID) | ORAL | 0 refills | Status: DC

## 2018-01-31 NOTE — ED Notes (Signed)
Pt teaching provided on medications that may cause drowsiness. Pt instructed not to drive or operate heavy machinery while taking the prescribed medication. Pt verbalized understanding.   

## 2018-01-31 NOTE — Discharge Instructions (Addendum)
Medications: Naprosyn, Robaxin  Treatment: Take Naprosyn twice daily as prescribed for your pain.  Take Robaxin twice daily as needed for muscle pain or spasms, but do not drive or operate machinery while taking this medication.  Use ice and heat alternating 20 minutes on, 20 minutes off and attempt the stretches discussed 3-4 times daily holding 30 seconds each.  Try to work on your posture while driving and distribute your weight more evenly.  Follow-up: Please follow-up with your doctor if your symptoms are not improving.  Please return the emergency department if you develop any new or worsening symptoms.

## 2018-01-31 NOTE — ED Provider Notes (Signed)
[facility] EMERGENCY DEPARTMENT Provider Note   CSN: [id number] Arrival date & time: 01/31/18  1321     History   Chief Complaint Chief Complaint  Patient presents with  . Pain    HPI Timothy Stark is a 32 y.o. male with history of anxiety who presents with a several month history of intermittent pain to his left posterior shoulder and left-sided neck.  Patient reports he gets tightness and burning pain behind his shoulder.  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  He has not tried anything at home for symptoms.  He also reports sometimes having intermittent sharp back and chest pains related to this.  He is also been evaluated for chest pain in the past and diagnosed with anxiety.  He denies chest pain today.  He has had all the symptoms for several months.  He denies any difficulty breathing, numbness or tingling in his hands.  Per triage note, patient was concerned about his urine being very yellow, however he states he just went to the bathroom and is clear.  HPI  Past Medical History:  Diagnosis Date  . Anxiety     Patient Active Problem List   Diagnosis Date Noted  . Encounter for annual physical exam 10/31/2017  . Anxiety disorder 08/08/2017  . History of panic attacks 08/08/2017    History reviewed. No pertinent surgical history.      Home Medications    Prior to Admission medications   Medication Sig Start Date End Date Taking? Authorizing Provider  hydrOXYzine (ATARAX/VISTARIL) 25 MG tablet Take 1 tablet (25 mg total) by mouth daily as needed for anxiety. 10/31/17   Claiborne Rigg, NP  methocarbamol (ROBAXIN) 500 MG tablet Take 1 tablet (500 mg total) by mouth 2 (two) times daily. 01/31/18   Dalma Panchal, Waylan Boga, PA-C  naproxen (NAPROSYN) 500 MG tablet Take 1 tablet (500 mg total) by mouth 2 (two) times daily. 01/31/18   Emi Holes, PA-C    Family History Family History  Problem Relation Age of  Onset  . Diabetes Mother   . Diabetes Father     Social History Social History   Tobacco Use  . Smoking status: Current Every Day Smoker    Packs/day: 0.50    Types: E-cigarettes, Cigarettes  . Smokeless tobacco: Never Used  Substance Use Topics  . Alcohol use: Yes    Comment: occ  . Drug use: No     Allergies   Patient has no known allergies.   Review of Systems Review of Systems  Musculoskeletal: Positive for myalgias and neck pain.  Neurological: Negative for numbness.     Physical Exam Updated Vital Signs BP 140/90 (BP Location: Left Arm)   Pulse (!) 116   Temp 98.4 F (36.9 C) (Oral)   Resp 18   Ht 5\' 9"  (1.753 m)   Wt 66.3 kg (146 lb 3.2 oz)   SpO2 100%   BMI 21.59 kg/m   Physical Exam  Constitutional: He appears well-developed and well-nourished. No distress.  HENT:  Head: Normocephalic and atraumatic.  Mouth/Throat: Oropharynx is clear and moist. No oropharyngeal exudate.  Eyes: Pupils are equal, round, and reactive to light. Conjunctivae are normal. Right eye exhibits no discharge. Left eye exhibits no discharge. No scleral icterus.  Neck: Normal range of motion. Neck supple. Muscular tenderness present. No spinous process tenderness present. No neck rigidity. No thyromegaly present.  Cardiovascular: Regular rhythm, normal heart  sounds and intact distal pulses. Exam reveals no gallop and no friction rub.  No murmur heard. Pulmonary/Chest: Effort normal and breath sounds normal. No stridor. No respiratory distress. He has no wheezes. He has no rales. He exhibits no tenderness.  Abdominal: Soft. Bowel sounds are normal. He exhibits no distension. There is no tenderness. There is no rebound and no guarding.  Musculoskeletal: He exhibits no edema.       Left shoulder: He exhibits tenderness and spasm. He exhibits no bony tenderness.       Cervical back: He exhibits no bony tenderness.       Back:  Muscle spasm noted to left upper trapezius and  posterior shoulder with tenderness, no bony tenderness of the shoulder, full range of motion, 5/5 strength and equal bilateral grip strength, sensation intact  Lymphadenopathy:    He has no cervical adenopathy.  Neurological: He is alert. Coordination normal.  Skin: Skin is warm and dry. No rash noted. He is not diaphoretic. No pallor.  Psychiatric: He has a normal mood and affect.  Nursing note and vitals reviewed.    ED Treatments / Results  Labs (all labs ordered are listed, but only abnormal results are displayed) Labs Reviewed - No data to display  EKG None  Radiology No results found.  Procedures Procedures (including critical care time)  Medications Ordered in ED Medications - No data to display   Initial Impression / Assessment and Plan / ED Course  I have reviewed the triage vital signs and the nursing notes.  Pertinent labs & imaging results that were available during my care of the patient were reviewed by me and considered in my medical decision making (see chart for details).     Patient with trapezius muscle spasm most likely related to positioning while driving tractor-trailer.  I discussed supportive treatment including stretching, ice, heat and will discharge home with Naprosyn and Robaxin.  Patient advised not to drive with Robaxin.  Patient very anxious about everything and being the emergency department, this most likely explains patient's heart rate of 116.  Patient is very well-appearing.  Patient advised to follow-up with his PCP if symptoms are not improving with the above treatments.  Regarding patient's urine, advised to make sure you are drinking plenty of fluids, as a transient change in urine color is most likely related to his hydration status.  Return precautions discussed.  Patient understands and agrees with plan.  Patient discharged in satisfactory condition.  Final Clinical Impressions(s) / ED Diagnoses   Final diagnoses:  Acute pain of left  shoulder  Trapezius muscle spasm    ED Discharge Orders        Ordered    naproxen (NAPROSYN) 500 MG tablet  2 times daily     01/31/18 1449    methocarbamol (ROBAXIN) 500 MG tablet  2 times daily     01/31/18 [address]       Demaris Leavell M, PA-C 01/31/18 1458    Tilden Fossaees, Elizabeth, MD 02/01/18 [phone]

## 2018-01-31 NOTE — ED Triage Notes (Addendum)
Pt c/o burning pain to posterior axillary area x2 months. Pt also states his urine is really yellow.

## 2018-03-10 ENCOUNTER — Encounter: Payer: Self-pay | Admitting: Nurse Practitioner

## 2018-03-10 ENCOUNTER — Ambulatory Visit: Payer: Self-pay | Attending: Nurse Practitioner | Admitting: Nurse Practitioner

## 2018-03-10 VITALS — BP 136/89 | HR 102 | Temp 98.5°F | Ht 68.0 in | Wt 147.6 lb

## 2018-03-10 DIAGNOSIS — Z818 Family history of other mental and behavioral disorders: Secondary | ICD-10-CM | POA: Insufficient documentation

## 2018-03-10 DIAGNOSIS — Z79899 Other long term (current) drug therapy: Secondary | ICD-10-CM | POA: Insufficient documentation

## 2018-03-10 DIAGNOSIS — F419 Anxiety disorder, unspecified: Secondary | ICD-10-CM | POA: Insufficient documentation

## 2018-03-10 DIAGNOSIS — R0981 Nasal congestion: Secondary | ICD-10-CM | POA: Insufficient documentation

## 2018-03-10 DIAGNOSIS — Z833 Family history of diabetes mellitus: Secondary | ICD-10-CM | POA: Insufficient documentation

## 2018-03-10 MED ORDER — MOMETASONE FUROATE 50 MCG/ACT NA SUSP
2.0000 | Freq: Every day | NASAL | 12 refills | Status: DC
Start: 2018-03-10 — End: 2018-04-01

## 2018-03-10 MED ORDER — BUSPIRONE HCL 15 MG PO TABS
15.0000 mg | ORAL_TABLET | Freq: Three times a day (TID) | ORAL | 1 refills | Status: AC | PRN
Start: 2018-03-10 — End: ?

## 2018-03-10 NOTE — Progress Notes (Signed)
Assessment & Plan:  Timothy Stark was seen today for anxiety.  Diagnoses and all orders for this visit:  Anxiety -     busPIRone (BUSPAR) 15 MG tablet; Take 1 tablet (15 mg total) by mouth 3 (three) times daily as needed.  Nasal congestion -     mometasone (NASONEX) 50 MCG/ACT nasal spray; Place 2 sprays into the nose daily.    Patient has been counseled on age-appropriate routine health concerns for screening and prevention. These are reviewed and up-to-date. Referrals have been placed accordingly. Immunizations are up-to-date or declined.    Subjective:   Chief Complaint  Patient presents with  . Anxiety   HPI Timothy Stark 32 y.o. male presents to office today to discuss anxiety. We have had multiple discussions regarding his history of anxiety and panic attacks. He has taken SSRI (celexa) and hydroxyzine in the past and reports he did not like the way Celexa made him feel and the hydroxyzine did not provide relief of his symptoms. He is agreeable today to trying Buspar. His depression screening is negative for depression. He endorses a family history of anxiety (mother and father).  GAD 7 : Generalized Anxiety Score 03/10/2018 10/31/2017 09/19/2017 08/08/2017  Nervous, Anxious, on Edge 1 0 0 1  Control/stop worrying 1 0 0 1  Worry too much - different things 1 0 0 1  Trouble relaxing 1 0 0 0  Restless 0 0 0 0  Easily annoyed or irritable 0 0 0 0  Afraid - awful might happen 1 0 0 0  Total GAD 7 Score 5 0 0 3   Depression screen PHQ 2/9 03/10/2018  Decreased Interest 0  Down, Depressed, Hopeless 0  PHQ - 2 Score 0  Altered sleeping 0  Tired, decreased energy 0  Change in appetite 1  Feeling bad or failure about yourself  0  Trouble concentrating 0  Moving slowly or fidgety/restless 0  Suicidal thoughts 0  PHQ-9 Score 1   Nasal Congestion Chronic and intermittent. Relieving factors: none. He has tried flonase in the past but endorses headaches with taking. He has also  tried sudafed with no relief. Aggravating factors: pollen. I will prescribe nasonex and have also recommended and OTC antihistamine.    Review of Systems  Constitutional: Negative for fever, malaise/fatigue and weight loss.  HENT: Positive for congestion. Negative for nosebleeds.   Eyes: Negative.  Negative for blurred vision, double vision and photophobia.  Respiratory: Negative.  Negative for cough and shortness of breath.   Cardiovascular: Negative.  Negative for chest pain, palpitations and leg swelling.  Gastrointestinal: Negative.  Negative for heartburn, nausea and vomiting.  Musculoskeletal: Negative.  Negative for myalgias.  Neurological: Negative.  Negative for dizziness, focal weakness, seizures and headaches.  Psychiatric/Behavioral: Negative for suicidal ideas. The patient is nervous/anxious.     Past Medical History:  Diagnosis Date  . Anxiety     History reviewed. No pertinent surgical history.  Family History  Problem Relation Age of Onset  . Diabetes Mother   . Anxiety disorder Mother   . Diabetes Father     Social History Reviewed with no changes to be made today.   Outpatient Medications Prior to Visit  Medication Sig Dispense Refill  . methocarbamol (ROBAXIN) 500 MG tablet Take 1 tablet (500 mg total) by mouth 2 (two) times daily. 20 tablet 0  . naproxen (NAPROSYN) 500 MG tablet Take 1 tablet (500 mg total) by mouth 2 (two) times daily. 30 tablet 0  .  hydrOXYzine (ATARAX/VISTARIL) 25 MG tablet Take 1 tablet (25 mg total) by mouth daily as needed for anxiety. 30 tablet 3   No facility-administered medications prior to visit.     No Known Allergies     Objective:    BP 136/89 (BP Location: Right Arm, Patient Position: Sitting, Cuff Size: Normal)   Pulse (!) 102   Temp 98.5 F (36.9 C) (Oral)   Ht 5\' 8"  (1.727 m)   Wt 147 lb 9.6 oz (67 kg)   SpO2 99%   BMI 22.44 kg/m  Wt Readings from Last 3 Encounters:  03/10/18 147 lb 9.6 oz (67 kg)  01/31/18  146 lb 3.2 oz (66.3 kg)  10/31/17 149 lb 3.2 oz (67.7 kg)    Physical Exam  Constitutional: He is oriented to person, place, and time. He appears well-developed and well-nourished. He is cooperative.  HENT:  Head: Normocephalic and atraumatic.  Nose: Mucosal edema and rhinorrhea present. Right sinus exhibits no maxillary sinus tenderness and no frontal sinus tenderness. Left sinus exhibits no maxillary sinus tenderness and no frontal sinus tenderness.  Eyes: EOM are normal.  Neck: Normal range of motion.  Cardiovascular: Regular rhythm and normal heart sounds. Tachycardia present. Exam reveals no gallop and no friction rub.  No murmur heard. Pulmonary/Chest: Effort normal and breath sounds normal. No tachypnea. No respiratory distress. He has no decreased breath sounds. He has no wheezes. He has no rhonchi. He has no rales. He exhibits no tenderness.  Abdominal: Bowel sounds are normal.  Musculoskeletal: Normal range of motion. He exhibits no edema.  Neurological: He is alert and oriented to person, place, and time. Coordination normal.  Skin: Skin is warm and dry.  Psychiatric: His speech is normal and behavior is normal. Judgment and thought content normal. His mood appears anxious. Cognition and memory are normal. He expresses no homicidal and no suicidal ideation. He expresses no suicidal plans and no homicidal plans.  Nursing note and vitals reviewed.     Patient has been counseled extensively about nutrition and exercise as well as the importance of adherence with medications and regular follow-up. The patient was given clear instructions to go to ER or return to medical center if symptoms don't improve, worsen or new problems develop. The patient verbalized understanding.   Follow-up: Return in about 3 weeks (around 03/31/2018) for anxiety/nasal congestion.   Claiborne Rigg, FNP-BC [facility] [place], [place] [phone]   03/10/2018, 1:21 PM

## 2018-03-10 NOTE — Patient Instructions (Signed)
Behavioral Health Resources:  ? ?What if I or someone I know is in crisis? ? ?If you are thinking about harming yourself or having thoughts of suicide, or if you know someone who is, seek help right away. ? ?Call your doctor or mental health care provider. ? ?Call 911 or go to a hospital emergency room to get immediate help, or ask a friend or family member to help you do these things. ? ?Call the [place] National Suicide Prevention Lifeline?s toll-free, 24-hour hotline at 1-800-273-TALK (1-800-273-8255) or TTY: 1-800-799-4 TTY (1-800-799-4889) to talk to a trained counselor. ? ?If you are in crisis, make sure you are not left alone.  ? ?If someone else is in crisis, make sure he or she is not left alone ? ? ?24 Hour Availability ? ?[facility]  ?[address], [place], [place] [address]  ?336-832-9700 or 1-800-711-2635 ? ?[facility] Crisis Line ?(Domestic Violence, Rape & Victim Assistance ?336-273-7273 ? ?[facility]  ?[address] [place], [place]  [address]               1-855-788-8787 or 336-676-6840 ? ?[facility] [place] [facility]    ?(ONLY from 8am-4pm)    ?336-899-1505 ? ?[facility] (24/7)   ?1-877-626-1772 ? ?[place] National Suicide Hotline   ?1-800-273-8255 (TALK) ? ?Support from local police to aid getting patient to hospital (http://www.[place]-.gov/index.aspx?page=2797) ? ? ?      ?ONGOING BEHAVIORAL HEALTH SUPPORT FOR UNINSURED and UNDERINSURED:  ?[facility]  ?336-676-6840  ?[address]  ?Walk-in first time, Monday-Friday, 8:30am-5:00pm  ?*Bring snack, drink, something to do, long wait at first visit, they do have pharmacy for behavioral health medications/ Bring own interpreter at first visit, if needed ?[facility]  ?336-387-6161  ?[address]  ?Walk-in Monday-Friday, 8:30am-12pm & 1-2:30pm  ?*pacientes que hablen espanol, favor comunicarse con el Sr.  [place], extension 2244 o la [address], extension 3331 para hacer una cita  ?[facility]:  ?336-429-5600 or [email or web address]  ?[address], [address]  ?Call or email, may self-refer  ?* uninsured/underinsured, 19-64yo, have both mental health and substance use challenges  ?[facility]:  ?Phone ([phone]) [phone]; Fax [phone]  ?*Call to schedule an appointment  ?3rd Floor located @?[address] of [address]  ?Mon-Thursday: 8:30am-8:00pm Friday: 8:30am-7:00pm  ?* Be sure to park in a space labeled ?Psychology Department,? located to the right of the main door of the building. Enter the main doors facing the parking lot and take the elevator or stairs to the 3rd Floor.  ?[facility]:  ?336-832-9700 or  ?1-800-711-2635 (24/hour helpline)  ?[address]  ?Call to make appointment, tends to be a long wait to begin services, depending on insurance  ?[facility]  ?(336) [phone] ??  ?*Call to schedule an appointment  ?[address]  ?Monday-Friday, 8:00am-5:00pm  ?[facility]  ?([phone]  ?[address], [place]  ?Monday-Friday, walk-in 8am-3pm  ?First appointment is assessment, then will make appointment for psychiatry   ?  ?

## 2018-04-01 ENCOUNTER — Encounter: Payer: Self-pay | Admitting: Nurse Practitioner

## 2018-04-01 ENCOUNTER — Ambulatory Visit: Payer: Self-pay | Attending: Nurse Practitioner | Admitting: Nurse Practitioner

## 2018-04-01 VITALS — BP 129/81 | HR 100 | Temp 99.0°F | Ht 68.0 in | Wt 146.6 lb

## 2018-04-01 DIAGNOSIS — Z818 Family history of other mental and behavioral disorders: Secondary | ICD-10-CM | POA: Insufficient documentation

## 2018-04-01 DIAGNOSIS — R0981 Nasal congestion: Secondary | ICD-10-CM | POA: Insufficient documentation

## 2018-04-01 DIAGNOSIS — Z833 Family history of diabetes mellitus: Secondary | ICD-10-CM | POA: Insufficient documentation

## 2018-04-01 DIAGNOSIS — Z79899 Other long term (current) drug therapy: Secondary | ICD-10-CM | POA: Insufficient documentation

## 2018-04-01 DIAGNOSIS — F410 Panic disorder [episodic paroxysmal anxiety] without agoraphobia: Secondary | ICD-10-CM | POA: Insufficient documentation

## 2018-04-01 DIAGNOSIS — F17210 Nicotine dependence, cigarettes, uncomplicated: Secondary | ICD-10-CM | POA: Insufficient documentation

## 2018-04-01 NOTE — Progress Notes (Signed)
Assessment & Plan:  [name] was seen today for follow-up.  Diagnoses and all orders for this visit:  Panic disorder without agoraphobia Continue buspar as prescribed.   Nasal congestion Cannot tolerate Flonase.  Cannot afford mometasone.  Currently taking an OTC steroid nasal spray which he reports provides some relief of his nasal congestion.  We had a long discussion regarding his tobacco use and I have instructed him that this may also be causing his persistent nasal and sinus congestion.  He verbalized understanding.   Tobacco Dependence due to cigarettes Timothy Stark was counseled on the dangers of tobacco use, and was advised to quit. Reviewed strategies to maximize success, including removing cigarettes and smoking materials from environment, stress management and support of family/friends as well as pharmacological alternatives including: Wellbutrin, Chantix, Nicotine patch, Nicotine gum or lozenges. Smoking cessation support: smoking cessation hotline: 1-800-QUIT-NOW.  Smoking cessation classes are also available through [facility]. Call (916) 447-6479 or visit our website at [email or web address].   A total of 4 minutes was spent on counseling for smoking cessation and Timothy Stark is not ready to quit.    Patient has been counseled on age-appropriate routine health concerns for screening and prevention. These are reviewed and up-to-date. Referrals have been placed accordingly. Immunizations are up-to-date or declined.    Subjective:   Chief Complaint  Patient presents with  . Follow-up    Pt. is here to follow-up on anxiety and nasal congestion. Pt. stated he takes Buspirone when he needs it.    HPI Timothy Stark 32 y.o. male presents to office today for follow up to anxiety. He was prescribed Buspar last month at his last office visit for anxiety. He continues to declines SSRI. Has taken Celexa in the past and did not like the way it made him feel. PHQ9 was  negative for depression at his last office visit.  Today he reports anxiety is minimally relieved. He notes dizziness the first few days of taking buspar 15mg  so he then cut the tablet in half. He experienced intermittent palpitations which resolved after several days. He is still not taking buspar every day. States he does not want to get "hooked" on medicine and "what happens down the road when they have an ad on tv that shows all the problems that occurred with people who have taken buspar".  His GAD score has improved today.   He states he is constantly worrying and over thinking. Lately he has been very concerned about the bags under his eyes and people telling him he looks tired. States now he has been comparing his eyes to every one else's eyes that he comes across. I have recommended he also follow up with a counselor or therapist to help with his coping skills related to his anxiety. He has not done this yet. States he is closing on a house in the next few months and will be moving to [place]. Concerned about his lab work and if he could have any type of cancer how would he know.  I explained to him that all of his lab work except his vitamin D in February was perfectly normal however prior to him moving to [place] I will order repeat lab work so that he will have updated lab results to take to his next provider. GAD 7 : Generalized Anxiety Score 04/01/2018 03/10/2018 10/31/2017 09/19/2017  Nervous, Anxious, on Edge 1 1 0 0  Control/stop worrying 1 1 0 0  Worry too much - different  things 1 1 0 0  Trouble relaxing 0 1 0 0  Restless 0 0 0 0  Easily annoyed or irritable 0 0 0 0  Afraid - awful might happen 0 1 0 0  Total GAD 7 Score 3 5 0 0   Depression screen PHQ 2/9 04/01/2018  Decreased Interest 0  Down, Depressed, Hopeless 0  PHQ - 2 Score 0  Altered sleeping 0  Tired, decreased energy 0  Change in appetite 1  Feeling bad or failure about yourself  0  Trouble concentrating 0  Moving  slowly or fidgety/restless 0  Suicidal thoughts 0  PHQ-9 Score 1      Review of Systems  Constitutional: Negative for fever, malaise/fatigue and weight loss.       Decreased appetite  HENT: Positive for congestion. Negative for nosebleeds.   Eyes: Negative.  Negative for blurred vision, double vision and photophobia.  Respiratory: Negative.  Negative for cough and shortness of breath.   Cardiovascular: Negative.  Negative for chest pain, palpitations and leg swelling.  Gastrointestinal: Negative.  Negative for heartburn, nausea and vomiting.  Musculoskeletal: Negative.  Negative for myalgias.  Neurological: Negative.  Negative for dizziness, focal weakness, seizures and headaches.  Psychiatric/Behavioral: Negative for depression and suicidal ideas. The patient is nervous/anxious.     Past Medical History:  Diagnosis Date  . Anxiety     History reviewed. No pertinent surgical history.  Family History  Problem Relation Age of Onset  . Diabetes Mother   . Anxiety disorder Mother   . Diabetes Father     Social History Reviewed with no changes to be made today.   Outpatient Medications Prior to Visit  Medication Sig Dispense Refill  . busPIRone (BUSPAR) 15 MG tablet Take 1 tablet (15 mg total) by mouth 3 (three) times daily as needed. 60 tablet 1  . methocarbamol (ROBAXIN) 500 MG tablet Take 1 tablet (500 mg total) by mouth 2 (two) times daily. (Patient not taking: Reported on 04/01/2018) 20 tablet 0  . mometasone (NASONEX) 50 MCG/ACT nasal spray Place 2 sprays into the nose daily. (Patient not taking: Reported on 04/01/2018) 17 g 12  . naproxen (NAPROSYN) 500 MG tablet Take 1 tablet (500 mg total) by mouth 2 (two) times daily. (Patient not taking: Reported on 04/01/2018) 30 tablet 0   No facility-administered medications prior to visit.     No Known Allergies     Objective:    BP 129/81 (BP Location: Right Arm, Patient Position: Sitting, Cuff Size: Normal)   Pulse 100    Temp 99 F (37.2 C) (Oral)   Ht 5\' 8"  (1.727 m)   Wt 146 lb 9.6 oz (66.5 kg)   SpO2 97%   BMI 22.29 kg/m  Wt Readings from Last 3 Encounters:  04/01/18 146 lb 9.6 oz (66.5 kg)  03/10/18 147 lb 9.6 oz (67 kg)  01/31/18 146 lb 3.2 oz (66.3 kg)    Physical Exam  Constitutional: He is oriented to person, place, and time. He appears well-developed and well-nourished. He is cooperative.  HENT:  Head: Normocephalic and atraumatic.  Nose: Mucosal edema present. Right sinus exhibits no maxillary sinus tenderness and no frontal sinus tenderness. Left sinus exhibits no maxillary sinus tenderness and no frontal sinus tenderness.  Mouth/Throat: Uvula is midline, oropharynx is clear and moist and mucous membranes are normal. No oropharyngeal exudate, posterior oropharyngeal edema or posterior oropharyngeal erythema.  Eyes: EOM are normal.  Neck: Normal range of motion.  Cardiovascular: Regular  rhythm and normal heart sounds. Tachycardia present. Exam reveals no gallop and no friction rub.  No murmur heard. Pulmonary/Chest: Effort normal and breath sounds normal. No tachypnea. No respiratory distress. He has no decreased breath sounds. He has no wheezes. He has no rhonchi. He has no rales. He exhibits no tenderness.  Abdominal: Soft. Bowel sounds are normal.  Musculoskeletal: Normal range of motion. He exhibits no edema.  Neurological: He is alert and oriented to person, place, and time. Coordination normal.  Skin: Skin is warm and dry.  Psychiatric: His speech is normal and behavior is normal. Judgment and thought content normal. His mood appears anxious. Cognition and memory are normal. He expresses no homicidal and no suicidal ideation. He expresses no suicidal plans and no homicidal plans.  Nursing note and vitals reviewed.     Patient has been counseled extensively about nutrition and exercise as well as the importance of adherence with medications and regular follow-up. The patient was given  clear instructions to go to ER or return to medical center if symptoms don't improve, worsen or new problems develop. The patient verbalized understanding.   Follow-up: Return if symptoms worsen or fail to improve.   Claiborne Rigg, FNP-BC [facility] [place], [place] [phone]   04/01/2018, 1:21 PM

## 2018-05-01 IMAGING — CT CT RENAL STONE PROTOCOL
2 of 4 series · 15 of 46 positions shown, 17 images · non-contrast
Comparison: None.

CLINICAL DATA: Left flank pain since yesterday.

EXAM:
CT ABDOMEN AND PELVIS WITHOUT CONTRAST
TECHNIQUE: Multidetector CT imaging of the abdomen and pelvis was performed
following the standard protocol without IV contrast.

[Series 2: axial st · axial · 0.84mm/px · z∈[-501,-81]mm · 12 of 100 slices shown, 14 images]
[im 8/100  soft-tissue]
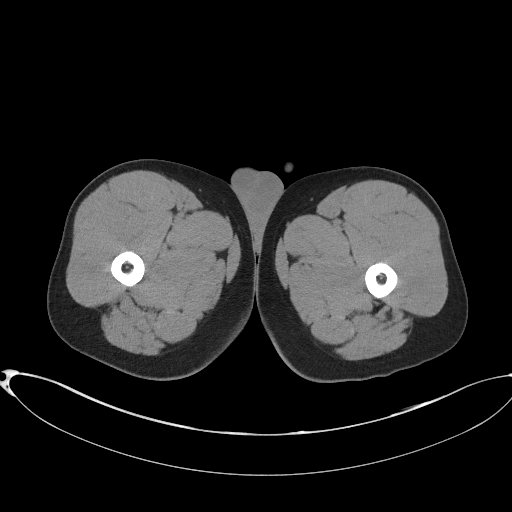
[im 8/100  bone]
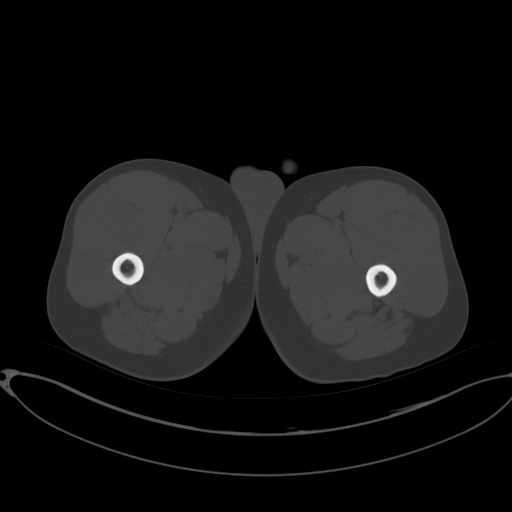
[im 16/100  soft-tissue]
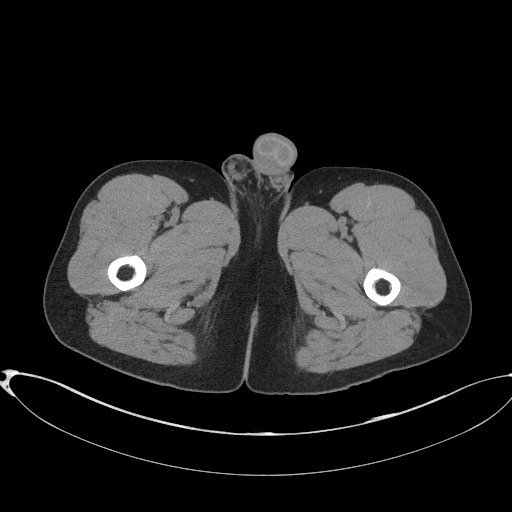
[im 23/100  soft-tissue]
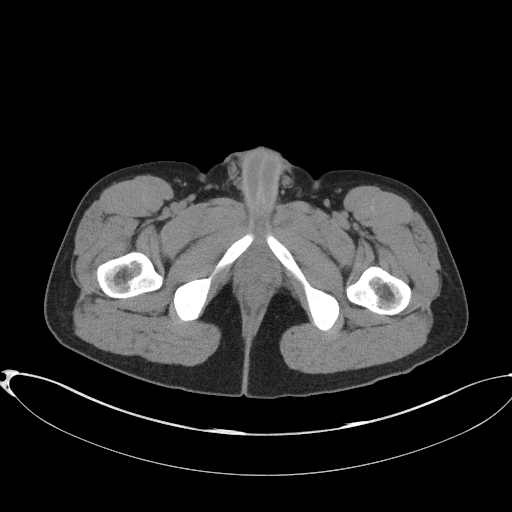
[im 31/100  soft-tissue]
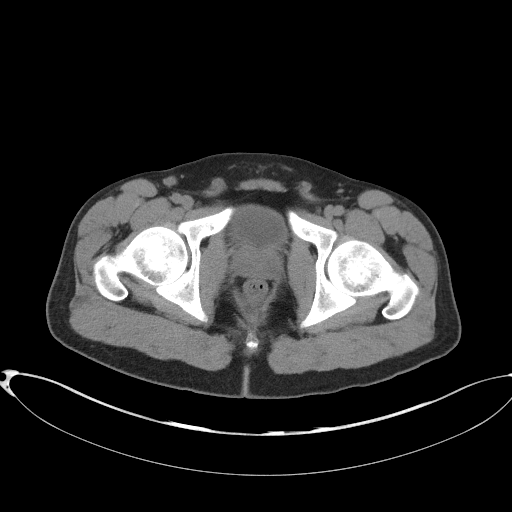
[im 39/100  soft-tissue]
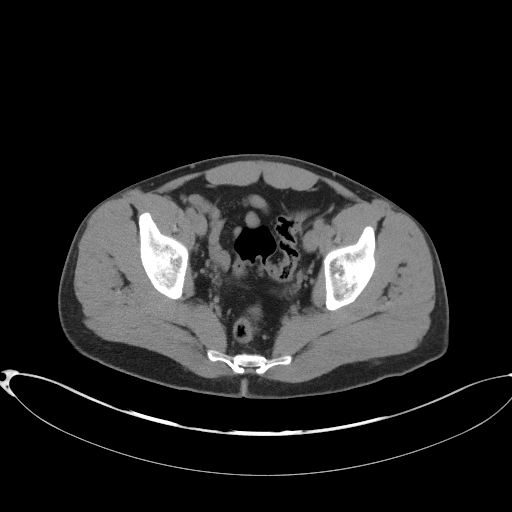
[im 46/100  soft-tissue]
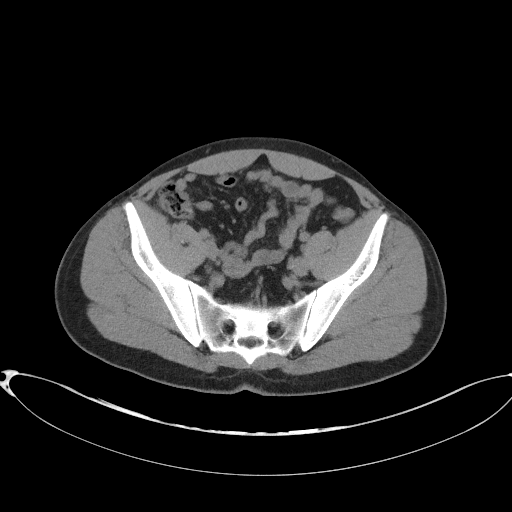
[im 54/100  soft-tissue]
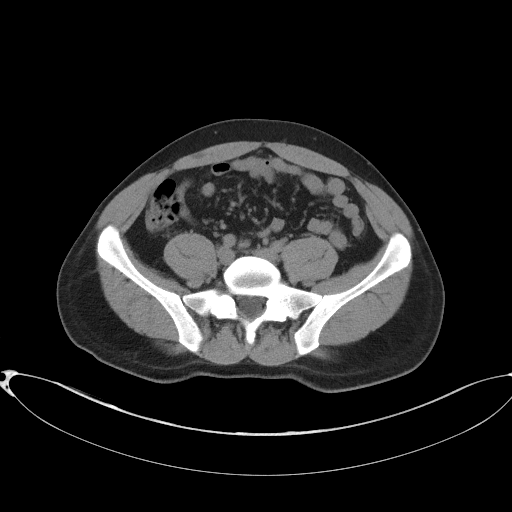
[im 61/100  soft-tissue]
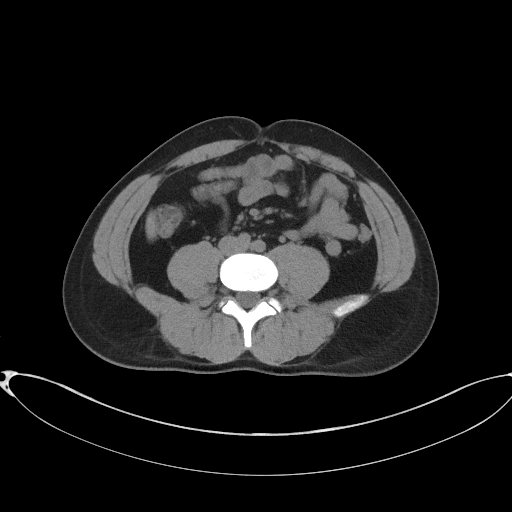
[im 69/100  soft-tissue]
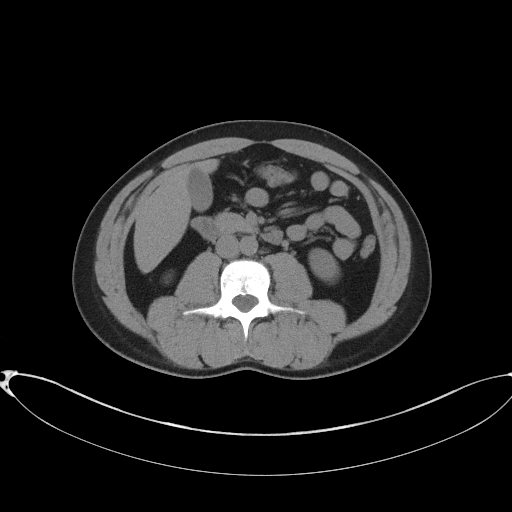
[im 69/100  bone]
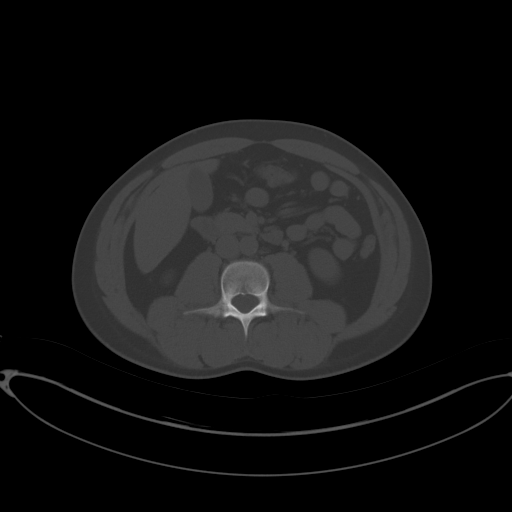
[im 77/100  soft-tissue]
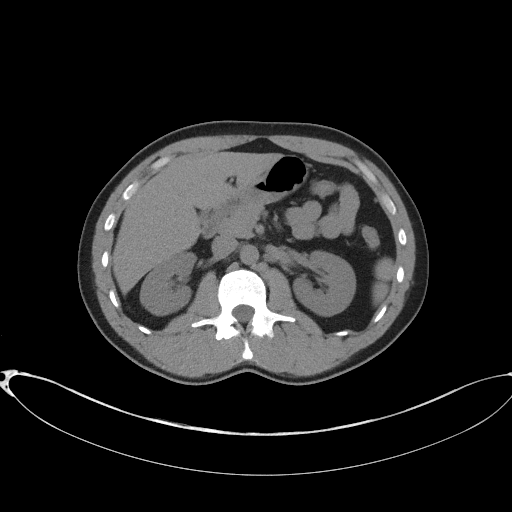
[im 84/100  soft-tissue]
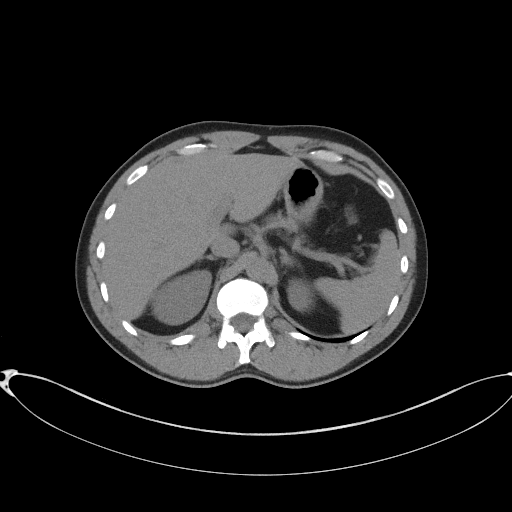
[im 92/100  soft-tissue]
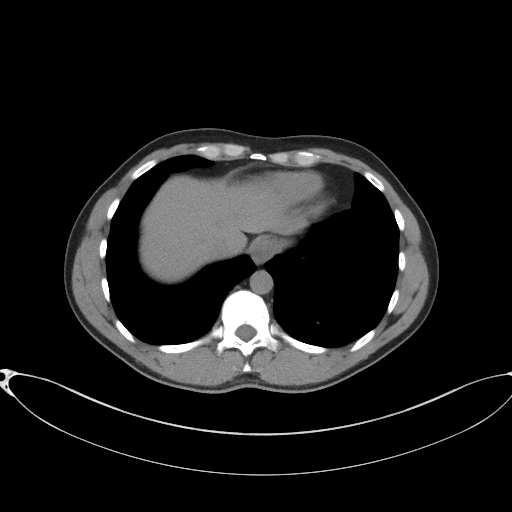

[Series 4: coronal st · coronal · 0.74mm/px · 3 of 83 slices shown]
[im 28/83  soft-tissue]
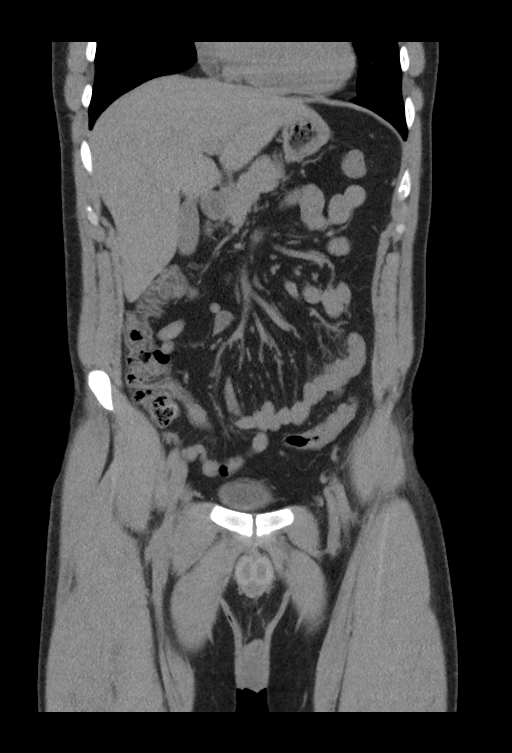
[im 37/83  soft-tissue]
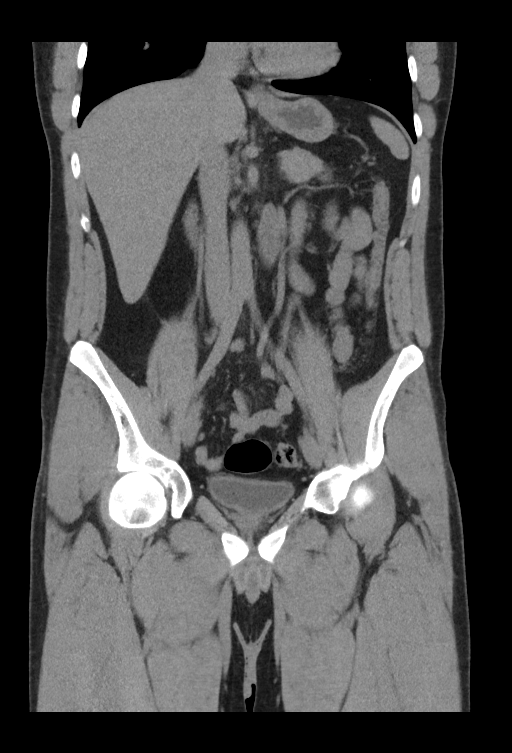
[im 46/83  soft-tissue]
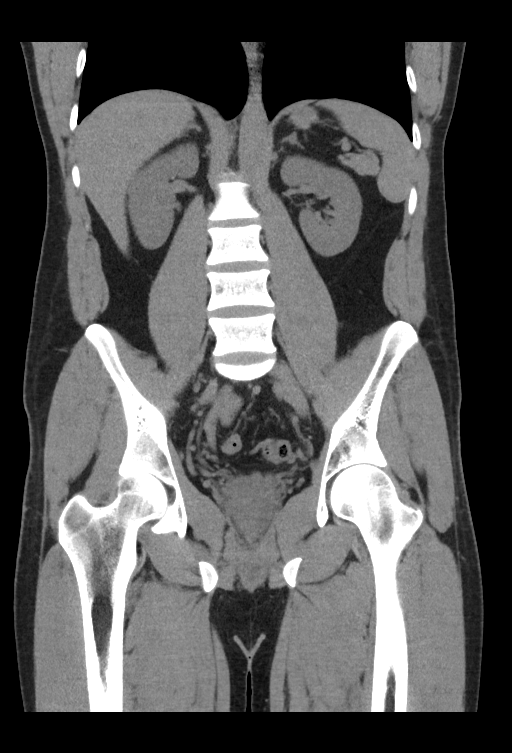

[15 of 46 positions shown; findings below may reference images not displayed]

FINDINGS: Lower chest:  Unremarkable.

Hepatobiliary: No focal abnormality in the liver on this study
without intravenous contrast. Sludge noted in the gallbladder lumen.
No intrahepatic or extrahepatic biliary dilation.

Pancreas: No focal mass lesion. No dilatation of the main duct. No
intraparenchymal cyst. No peripancreatic edema.

Spleen: No splenomegaly. No focal mass lesion.

Adrenals/Urinary Tract: No adrenal nodule or mass. No stones are
seen in either kidney. No secondary changes in either kidney or
ureter. No ureteral stones. No bladder stones.

Stomach/Bowel: Stomach is nondistended. No gastric wall thickening.
No evidence of outlet obstruction. Duodenum is normally positioned
as is the ligament of Treitz. No small bowel wall thickening. No
small bowel dilatation. The terminal ileum is normal. The appendix
is normal. No gross colonic mass. No colonic wall thickening. No
substantial diverticular change.

Vascular/Lymphatic: No abdominal aortic aneurysm. There is no
gastrohepatic or hepatoduodenal ligament lymphadenopathy. No
intraperitoneal or retroperitoneal lymphadenopathy. No pelvic
sidewall lymphadenopathy.

Reproductive: The prostate gland and seminal vesicles have normal
imaging features.

Other: No intraperitoneal free fluid.

Musculoskeletal: Bone windows reveal no worrisome lytic or sclerotic
osseous lesions.
IMPRESSION: 1. No acute findings in the abdomen or pelvis. Specifically, no
findings to explain the patient's history of left flank pain.

## 2018-07-07 IMAGING — DX DG CHEST 2V
2 series · 2 of 2 positions shown · non-contrast
Comparison: 11/03/2011

CLINICAL DATA: Intermittent chest pain, right-sided facial numbness
with headache

EXAM:
CHEST  2 VIEW

[chest pa]
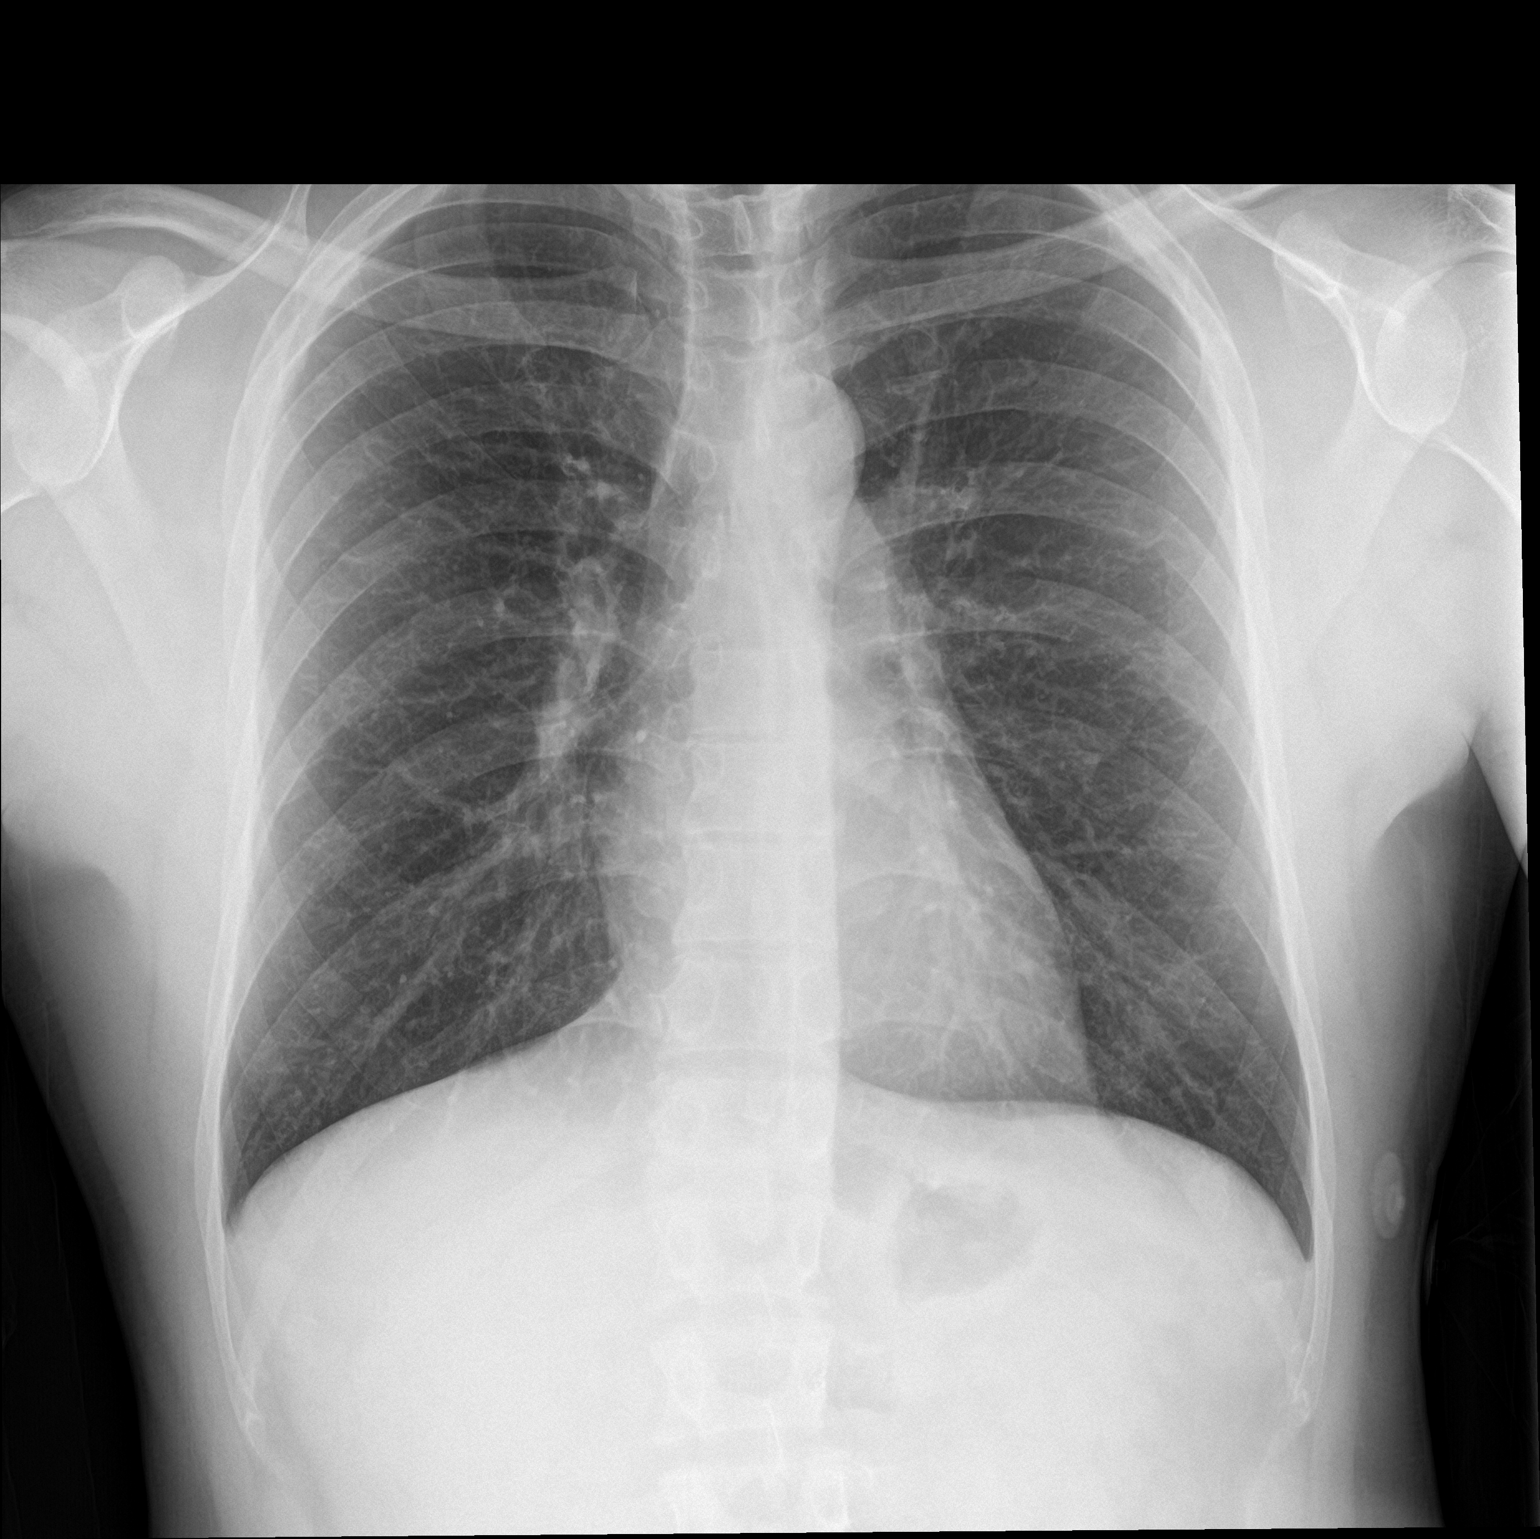

[chest lat]
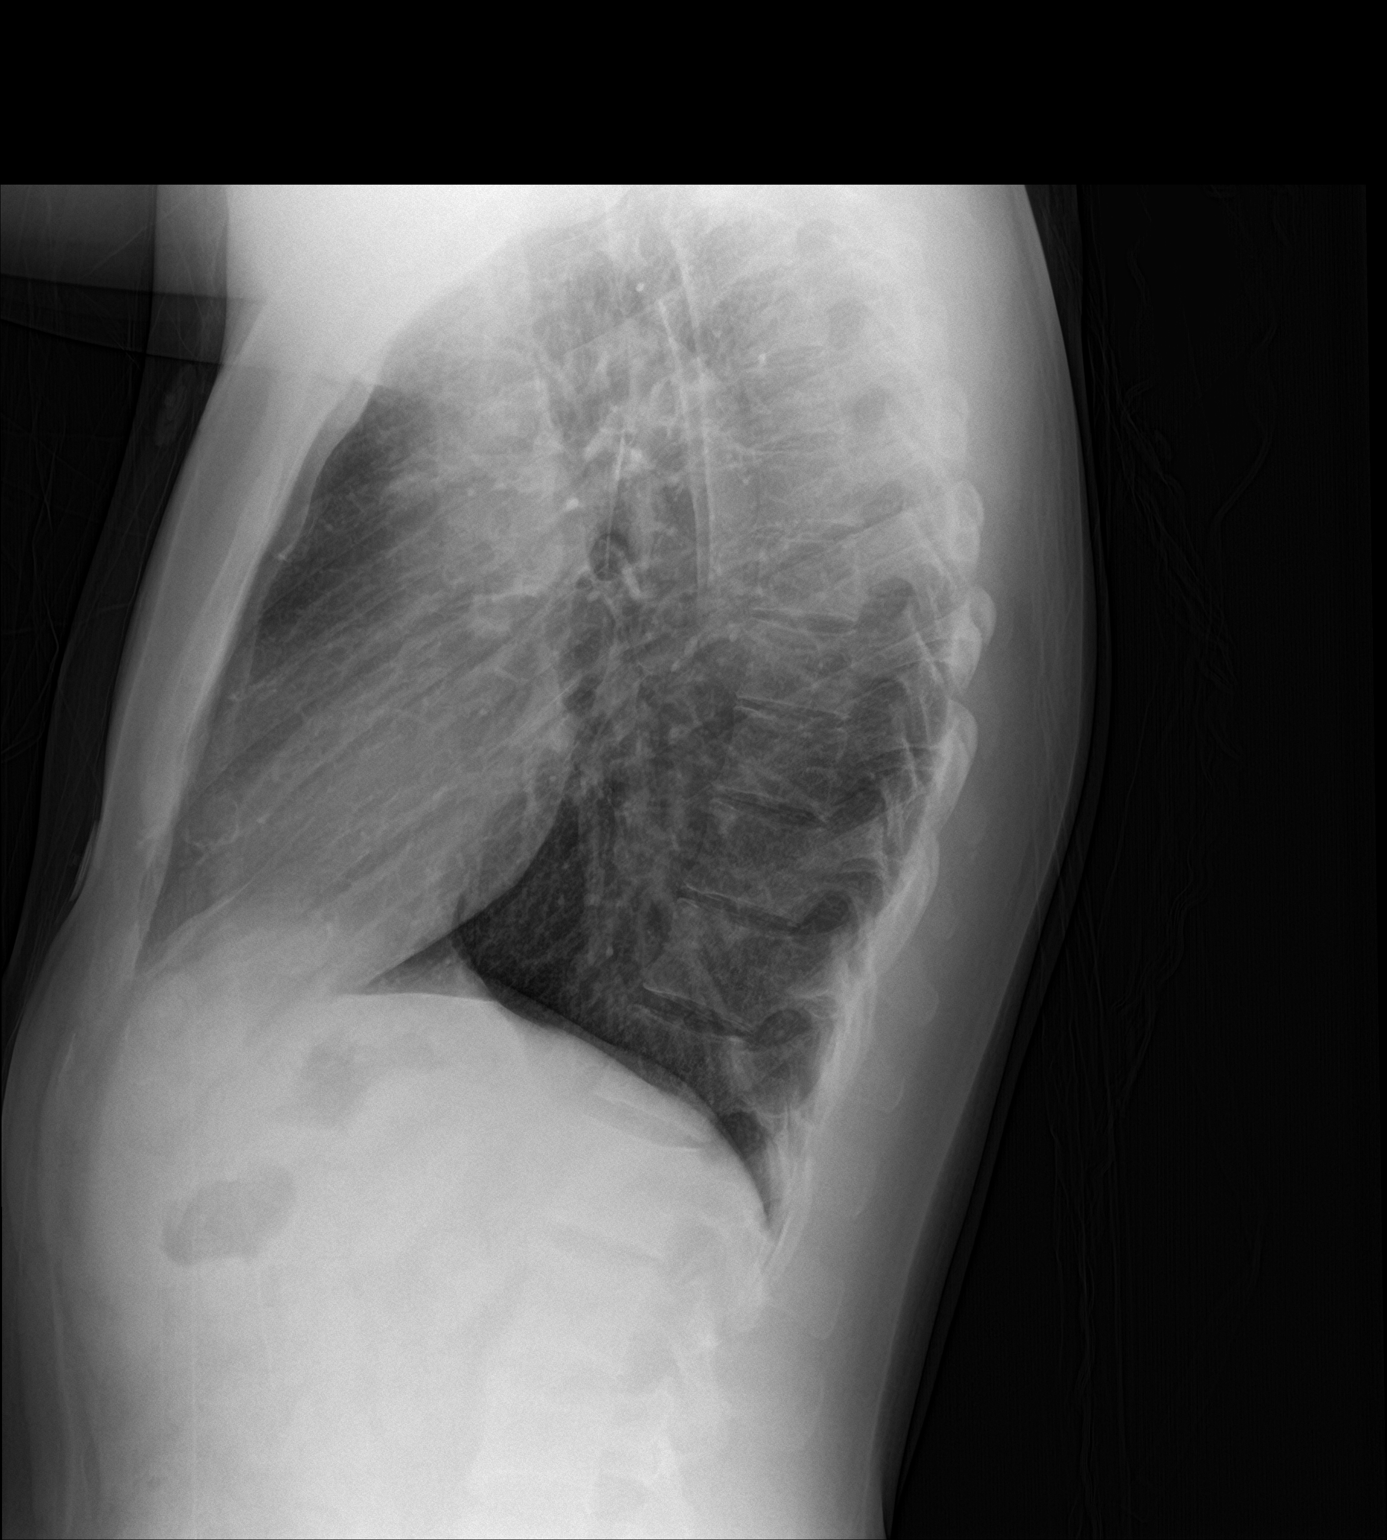

[2 of 2 positions shown; findings below may reference images not displayed]

FINDINGS: The heart size and mediastinal contours are within normal limits.
Both lungs are clear. The visualized skeletal structures are
unremarkable.
IMPRESSION: No active cardiopulmonary disease.
